# Patient Record
Sex: Male | Born: 1945
Health system: Southern US, Community
[De-identification: ages and names within clinical notes are randomized; demographics above are authoritative.]

## PROBLEM LIST (undated history)

## (undated) DIAGNOSIS — N401 Enlarged prostate with lower urinary tract symptoms: Secondary | ICD-10-CM

## (undated) DIAGNOSIS — L570 Actinic keratosis: Secondary | ICD-10-CM

## (undated) DIAGNOSIS — Z973 Presence of spectacles and contact lenses: Secondary | ICD-10-CM

## (undated) DIAGNOSIS — E7800 Pure hypercholesterolemia, unspecified: Secondary | ICD-10-CM

## (undated) DIAGNOSIS — I10 Essential (primary) hypertension: Secondary | ICD-10-CM

## (undated) DIAGNOSIS — F419 Anxiety disorder, unspecified: Secondary | ICD-10-CM

## (undated) DIAGNOSIS — Z860101 Personal history of adenomatous and serrated colon polyps: Secondary | ICD-10-CM

## (undated) DIAGNOSIS — I2510 Atherosclerotic heart disease of native coronary artery without angina pectoris: Secondary | ICD-10-CM

## (undated) DIAGNOSIS — R351 Nocturia: Secondary | ICD-10-CM

## (undated) DIAGNOSIS — H9190 Unspecified hearing loss, unspecified ear: Secondary | ICD-10-CM

## (undated) DIAGNOSIS — Z86010 Personal history of colonic polyps: Secondary | ICD-10-CM

## (undated) DIAGNOSIS — Z85828 Personal history of other malignant neoplasm of skin: Secondary | ICD-10-CM

## (undated) DIAGNOSIS — C61 Malignant neoplasm of prostate: Secondary | ICD-10-CM

## (undated) DIAGNOSIS — N400 Enlarged prostate without lower urinary tract symptoms: Secondary | ICD-10-CM

## (undated) DIAGNOSIS — E559 Vitamin D deficiency, unspecified: Secondary | ICD-10-CM

## (undated) DIAGNOSIS — R269 Unspecified abnormalities of gait and mobility: Secondary | ICD-10-CM

## (undated) HISTORY — PX: BASAL CELL CARCINOMA EXCISION: SHX1214

## (undated) HISTORY — DX: Nocturia: R35.1

## (undated) HISTORY — PX: APPENDECTOMY: SHX54

## (undated) HISTORY — DX: Atherosclerotic heart disease of native coronary artery without angina pectoris: I25.10

## (undated) HISTORY — DX: Actinic keratosis: L57.0

## (undated) HISTORY — DX: Vitamin D deficiency, unspecified: E55.9

## (undated) HISTORY — DX: Pure hypercholesterolemia, unspecified: E78.00

## (undated) HISTORY — DX: Benign prostatic hyperplasia without lower urinary tract symptoms: N40.0

## (undated) HISTORY — DX: Essential (primary) hypertension: I10

## (undated) HISTORY — DX: Anxiety disorder, unspecified: F41.9

## (undated) HISTORY — DX: Malignant neoplasm of prostate: C61

## (undated) HISTORY — PX: TONSILLECTOMY AND ADENOIDECTOMY: SUR1326

## (undated) HISTORY — PX: PROSTATE BIOPSY: SHX241

---
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
## 1998-07-18 ENCOUNTER — Encounter: Admission: RE | Admit: 1998-07-18 | Discharge: 1998-07-18 | Payer: Self-pay | Admitting: Infectious Diseases

## 1998-09-29 ENCOUNTER — Emergency Department (HOSPITAL_COMMUNITY): Admission: EM | Admit: 1998-09-29 | Discharge: 1998-09-29 | Payer: Self-pay | Admitting: Emergency Medicine

## 1998-09-29 ENCOUNTER — Encounter: Payer: Self-pay | Admitting: Emergency Medicine

## 1999-11-17 ENCOUNTER — Emergency Department (HOSPITAL_COMMUNITY): Admission: EM | Admit: 1999-11-17 | Discharge: 1999-11-17 | Payer: Self-pay | Admitting: Emergency Medicine

## 2001-07-21 ENCOUNTER — Encounter (INDEPENDENT_AMBULATORY_CARE_PROVIDER_SITE_OTHER): Payer: Self-pay | Admitting: *Deleted

## 2001-07-21 ENCOUNTER — Ambulatory Visit (HOSPITAL_COMMUNITY): Admission: RE | Admit: 2001-07-21 | Discharge: 2001-07-21 | Payer: Self-pay | Admitting: *Deleted

## 2003-07-25 ENCOUNTER — Ambulatory Visit (HOSPITAL_COMMUNITY): Admission: RE | Admit: 2003-07-25 | Discharge: 2003-07-25 | Payer: Self-pay | Admitting: *Deleted

## 2004-08-14 ENCOUNTER — Emergency Department (HOSPITAL_COMMUNITY): Admission: EM | Admit: 2004-08-14 | Discharge: 2004-08-14 | Payer: Self-pay | Admitting: *Deleted

## 2009-09-22 ENCOUNTER — Emergency Department (HOSPITAL_COMMUNITY): Admission: EM | Admit: 2009-09-22 | Discharge: 2009-09-23 | Payer: Self-pay | Admitting: Emergency Medicine

## 2010-11-11 LAB — POCT I-STAT, CHEM 8
BUN: 17 mg/dL (ref 6–23)
Calcium, Ion: 1.19 mmol/L (ref 1.12–1.32)
Chloride: 109 mEq/L (ref 96–112)
Creatinine, Ser: 1.1 mg/dL (ref 0.4–1.5)
Glucose, Bld: 118 mg/dL — ABNORMAL HIGH (ref 70–99)
HCT: 44 % (ref 39.0–52.0)
Hemoglobin: 15 g/dL (ref 13.0–17.0)
Potassium: 4.2 mEq/L (ref 3.5–5.1)
Sodium: 139 mEq/L (ref 135–145)
TCO2: 24 mmol/L (ref 0–100)

## 2010-11-11 LAB — URINALYSIS, ROUTINE W REFLEX MICROSCOPIC
Bilirubin Urine: NEGATIVE
Glucose, UA: NEGATIVE mg/dL
Hgb urine dipstick: NEGATIVE
Ketones, ur: NEGATIVE mg/dL
Nitrite: NEGATIVE
Protein, ur: NEGATIVE mg/dL
Specific Gravity, Urine: 1.02 (ref 1.005–1.030)
Urobilinogen, UA: 1 mg/dL (ref 0.0–1.0)
pH: 6.5 (ref 5.0–8.0)

## 2010-11-11 LAB — DIFFERENTIAL
Basophils Absolute: 0 10*3/uL (ref 0.0–0.1)
Basophils Relative: 1 % (ref 0–1)
Eosinophils Absolute: 0.2 10*3/uL (ref 0.0–0.7)
Eosinophils Relative: 3 % (ref 0–5)
Lymphocytes Relative: 25 % (ref 12–46)
Lymphs Abs: 1.7 10*3/uL (ref 0.7–4.0)
Monocytes Absolute: 0.7 10*3/uL (ref 0.1–1.0)
Monocytes Relative: 10 % (ref 3–12)
Neutro Abs: 4.1 10*3/uL (ref 1.7–7.7)
Neutrophils Relative %: 61 % (ref 43–77)

## 2010-11-11 LAB — CBC
HCT: 43.8 % (ref 39.0–52.0)
Hemoglobin: 14.6 g/dL (ref 13.0–17.0)
MCHC: 33.3 g/dL (ref 30.0–36.0)
MCV: 91.3 fL (ref 78.0–100.0)
Platelets: 204 10*3/uL (ref 150–400)
RBC: 4.8 MIL/uL (ref 4.22–5.81)
RDW: 13.2 % (ref 11.5–15.5)
WBC: 6.7 10*3/uL (ref 4.0–10.5)

## 2011-01-10 NOTE — Op Note (Signed)
NAME:  Willie Farrell, Willie Farrell                          ACCOUNT NO.:  [id number]   MEDICAL RECORD NO.:  [id number]                   PATIENT TYPE:  AMB   LOCATION:  ENDO                                 FACILITY:  [facility]   PHYSICIAN:  Georgiana Spinner, M.D.                 DATE OF BIRTH:  August 28, 1945   DATE OF PROCEDURE:  DATE OF DISCHARGE:                                 OPERATIVE REPORT   PROCEDURE:  Colonoscopy.   INDICATIONS:  Rectal bleeding.  The patient have bright red rectal bleeding.  Last time was five days ago.  He had a colonoscopy done by me two years ago  which showed diverticulosis.   ANESTHESIA:  None given at patient's request.   DESCRIPTION OF PROCEDURE:  With the patient mildly sedated and in the left  lateral decubitus position, the rectal examination was performed which was  unremarkable.  Subsequently the Olympus videoscopic colonoscope was inserted  into the rectum which appeared normal and was photographed and placed in  retroflexion to view the anal canal from above and it, too, appeared normal  as it had previously.  The endoscope was then straightened and advanced as  far as we could until it reached the proximal colon and the ascending colon  just above the ileocecal valve, at which point the discomfort to the patient  became intolerable and we decided to stop at that point and we withdrew the  colonoscope taking circumferential views of the colonic mucosa.  The prep  was slightly suboptimal but no gross lesions were seen and we decompressed  as we exited.  The patient's vital signs and pulse oximetry remained stable.  The patient tolerated the procedure well without apparent complications  other than transitory discomfort.   FINDINGS:  Mild diverticulosis as previously in the sigmoid colon.  Otherwise an unremarkable colonoscopic examination to the ascending colon.  The cecum and terminal ileum have been seen previously.   IMPRESSION:  Essentially negative  examination other than minimal  diverticulosis.   PLAN:  Have the patient follow up with me on the as-needed basis only.                                               Georgiana Spinner, M.D.    GMO/MEDQ  D:  07/25/2003  T:  07/25/2003  Job:  [id number]

## 2011-01-10 NOTE — Procedures (Signed)
[facility]  Patient:    Willie Farrell, Willie Farrell Visit Number: [id number] MRN: [id number]          Service Type: END Location: ENDO Attending Physician:  Sabino Gasser Dictated by:   Sabino Gasser, M.D. Proc. Date: 07/21/01 Admit Date:  07/21/2001                             Procedure Report  PROCEDURE:  Upper endoscopy.  INDICATION FOR PROCEDURE:  Gastroesophageal reflux disease.  ANESTHESIA:  Demerol 70, Versed 6 mg.  DESCRIPTION OF PROCEDURE:  With the patient mildly sedated in the left lateral decubitus position, the Olympus videoscopic endoscope was inserted in the mouth and passed under direct vision through the esophagus which appeared normal. There was no evidence of Barretts esophagus seen but there was a hiatal hernia photographed. We entered into the stomach. The fundus, body, and antrum were visualized. In the body and antrum, the area of the stomach was somewhat reddened and inflamed and there was blood flex actually seen in the stomach. This was photographed. We entered through the pylorus into the duodenal bulb which showed some changes of mild duodenitis. The second portion of the duodenum appeared normal. From this point, the endoscope was slowly withdrawn taking circumferential views of the entire duodenal mucosa until the endoscope was then pulled back in the stomach, placed in retroflexion to view the stomach from below and a large hiatal hernia was seen and photographed. The endoscope was then straightened and withdrawn taking circumferential views of the remaining gastric and esophageal mucosa stopping only to biopsy the body and antrum that had been noted previously. The patients vital signs and pulse oximeter remained stable. The patient tolerated the procedure well without apparent complications.  FINDINGS:  Changes in the body and antrum of gastritis with some blood flecks seen in the stomach, hiatal hernia, mild duodenitis.  PLAN:    Await biopsy report. The patient will call me for results or check with me and follow-up with me as an outpatient. Proceed to colonoscopy as planned. Dictated by:   Sabino Gasser, M.D. Attending Physician:  Sabino Gasser DD:  07/21/01 TD:  07/21/01 Job: [id number]

## 2011-01-10 NOTE — Procedures (Signed)
[facility]  Patient:    Willie Farrell, Willie Farrell Visit Number: [id number] MRN: [id number]          Service Type: END Location: ENDO Attending Physician:  Sabino Gasser Dictated by:   Sabino Gasser, M.D. Proc. Date: 07/21/01 Admit Date:  07/21/2001                             Procedure Report  PROCEDURE:  Colonoscopy.  INDICATION FOR PROCEDURE:  Heme positive stools, colon cancer screening.  ANESTHESIA:  Demerol 30, Versed 4 mg.  DESCRIPTION OF PROCEDURE:  With the patient mildly sedated in the left lateral decubitus position, the Olympus videoscopic colonoscope was inserted into her rectum after a normal rectal exam passed under direct vision to the cecum identified by the ileocecal valve and appendiceal orifice. We entered into the ileocecal valve and terminal ileum also appeared normal. From this point, the colonoscope was slowly withdrawn taking circumferential views of the entire colonic mucosa, stopping in the sigmoid colon to photograph mild to moderate diverticulosis. The rectum appeared normal on direct and retroflexed view. The endoscope was then straightened and withdrawn through the anal canal which also appeared normal. The patients vital signs and pulse oximeter remained stable. The patient tolerated the procedure well without apparent complications.  FINDINGS:  Mild diverticulosis of sigmoid colon, otherwise an unremarkable colonoscopic examination including the terminal ileum.  PLAN:  See endoscopy note for further details. Dictated by:   Sabino Gasser, M.D. Attending Physician:  Sabino Gasser DD:  07/21/01 TD:  07/21/01 Job: [id number]

## 2011-01-13 ENCOUNTER — Other Ambulatory Visit: Payer: Self-pay | Admitting: Internal Medicine

## 2011-01-13 DIAGNOSIS — R1011 Right upper quadrant pain: Secondary | ICD-10-CM

## 2011-01-15 ENCOUNTER — Ambulatory Visit
Admission: RE | Admit: 2011-01-15 | Discharge: 2011-01-15 | Disposition: A | Discharge status: Home / Self Care | Payer: 59 | Source: Ambulatory Visit | Attending: Internal Medicine | Admitting: Internal Medicine

## 2011-01-15 DIAGNOSIS — R1011 Right upper quadrant pain: Secondary | ICD-10-CM

## 2014-02-27 ENCOUNTER — Other Ambulatory Visit: Payer: Self-pay | Admitting: Internal Medicine

## 2014-02-27 DIAGNOSIS — M899 Disorder of bone, unspecified: Secondary | ICD-10-CM

## 2014-03-01 ENCOUNTER — Other Ambulatory Visit: Payer: 59

## 2014-03-06 ENCOUNTER — Ambulatory Visit
Admission: RE | Admit: 2014-03-06 | Discharge: 2014-03-06 | Disposition: A | Discharge status: Home / Self Care | Payer: BC Managed Care – PPO | Source: Ambulatory Visit | Attending: Internal Medicine | Admitting: Internal Medicine

## 2014-03-06 DIAGNOSIS — M899 Disorder of bone, unspecified: Secondary | ICD-10-CM

## 2014-03-06 MED ORDER — GADOBENATE DIMEGLUMINE 529 MG/ML IV SOLN
20.0000 mL | Freq: Once | INTRAVENOUS | Status: AC | PRN
  Administered 2014-03-06: 20 mL via INTRAVENOUS

## 2016-03-04 ENCOUNTER — Other Ambulatory Visit: Payer: Self-pay | Admitting: Internal Medicine

## 2016-03-04 DIAGNOSIS — G459 Transient cerebral ischemic attack, unspecified: Secondary | ICD-10-CM

## 2016-03-05 ENCOUNTER — Ambulatory Visit
Admission: RE | Admit: 2016-03-05 | Discharge: 2016-03-05 | Disposition: A | Discharge status: Home / Self Care | Payer: BLUE CROSS/BLUE SHIELD | Source: Ambulatory Visit | Attending: Internal Medicine | Admitting: Internal Medicine

## 2016-03-05 DIAGNOSIS — G459 Transient cerebral ischemic attack, unspecified: Secondary | ICD-10-CM

## 2016-03-19 ENCOUNTER — Encounter: Payer: Self-pay | Admitting: Neurology

## 2016-03-19 ENCOUNTER — Ambulatory Visit (INDEPENDENT_AMBULATORY_CARE_PROVIDER_SITE_OTHER): Payer: BLUE CROSS/BLUE SHIELD | Admitting: Neurology

## 2016-03-19 VITALS — BP 120/79 | HR 81 | Resp 18 | Ht 72.0 in | Wt 244.0 lb

## 2016-03-19 DIAGNOSIS — R42 Dizziness and giddiness: Secondary | ICD-10-CM | POA: Diagnosis not present

## 2016-03-19 NOTE — Patient Instructions (Signed)
Please start taking a baby aspirin for prevention.   Please increase your water intake and decrease your sweet tea intake.   Exam looks good, negative for vertigo, negative for OH.   Think about seeing Dr. Einar Gip, in particular for consideration of heart monitor, Holter vs. Implantable.   Please remember, that dizziness can recur without warning, triggers could be stress, sleep deprivation, dehydration and even taking a bumpy car ride, sometimes an acute illness, even a common (viral) cold. It can last hours or days. Please change positions slowly and always stay well-hydrated.   Ask your wife to look out for snoring and apneic spells - talk to Dr. Shelia Media about doing a sleep study.   Work up was benign.  I will see you back as needed.

## 2016-03-19 NOTE — Progress Notes (Signed)
Subjective:    Patient ID: Willie Farrell is a 70 y.o. male.  HPI     Star Age, MD, PhD [facility] [address] [place], [place] [address]  Dear Dr. Shelia Media,   I saw your patient and colleague, Dr. Anda Kraft, upon your kind request in my neurologic clinic today for initial consultation of his dizziness, possible TIA. The patient is unaccompanied today. 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 He was seen at [facility] in the emergency room and had extensive workup in [place], [place]. At the time of the episode he was with his grandson. He had a head CT without contrast in the emergency room which was negative. He did have an episode of vomiting. This was before he went to the emergency room. Emergency room records were reviewed from 02/09/2016. He was noted to have sinus bradycardia, EKG showed incomplete right bundle branch block. Blood work showed normal CBC with differential, normal BMP, troponin negative, chest x-ray single view portable showed no acute cardiopulmonary disease. CT head without contrast on 02/09/2016 showed no evidence of an acute intracranial abnormality. He was felt to have near-syncope, possibly a hypoglycemic episode. Of note, blood sugar level was 124 at the time of checking in the emergency room.   He was discharged from the emergency room. He had another brief episode of dizziness upon standing up in church about 3 weeks later. This happened when he was kneeling and stood up, symptoms lasted for less than a minute. He denies frank spinning sensation or swaying but does sometimes feels a little off when walking, feels like he may be veering to one side. Thankfully he has not fallen. He denies any headache,  one-sided weakness, numbness, slurring of speech, droopy face, denies any sinister snoring, has never been told that he pauses in his breathing while asleep but wife has mentioned moaning while he is asleep. He has never had a sleep study.  Of note, he does not typically hydrate well with water. He likes to drink 2 large cups of coffee in the morning, 1 soda per day, usually 5 days a week, and sweet tea several glasses per day. He admits that he drinks sweet tea. His A1c he reports is less than 7. I reviewed your office note from 03/04/2016, which you kindly included. Vitals were stable. Exam was nonfocal. Of note, he did not see Dr. Einar Gip, but had an echocardiogram in his office on 02/21/2016 which showed left ventricle cavity of normal size, normal global wall motion. Calculated EF 56%, trace mitral regurgitation, mild tricuspid regurgitation, no evidence of pulmonary hypertension. Bilateral carotid ultrasound at Dr. Irven Shelling office from 02/21/2016 showed no hemodynamically significant arterial disease in the internal carotid arteries bilaterally. Normal study without any significant plaque burden. Antegrade right vertebral artery flow. Antegrade left vertebral artery flow. Current medications include Flomax, Crestor, he stopped myrbetriq. He was advised to start a baby aspirin but has not started it yet.  His Past Medical History Is Significant For: Past Medical History:  Diagnosis Date  . Actinic keratoses   . BPH (benign prostatic hyperplasia)   . Hypercholesteremia   . Hypertension   . Vitamin D deficiency     His Past Surgical History Is Significant For: Past Surgical History:  Procedure Laterality Date  . APPENDECTOMY    .  TONSILLECTOMY AND ADENOIDECTOMY      His Family History Is Significant For: Family History  Problem Relation Age of Onset  . Heart attack Father   . Diabetes Father     His Social History Is Significant For: Social History   Social History  . Marital status:  Married    Spouse name: N/A  . Number of children: 3  . Years of education: MD   Occupational History  . [employer]    Social History Main Topics  . Smoking status: Former Research scientist (life sciences)  . Smokeless tobacco: None     Comment: only smoked in college  . Alcohol use Yes  . Drug use: No  . Sexual activity: Not Asked   Other Topics Concern  . None   Social History Narrative   Drinks 2 cups of coffee a day     His Allergies Are:  No Known Allergies:   His Current Medications Are:  Outpatient Encounter Prescriptions as of 03/19/2016  Medication Sig  . rosuvastatin (CRESTOR) 20 MG tablet Take 20 mg by mouth daily.  . tamsulosin (FLOMAX) 0.4 MG CAPS capsule Take 0.4 mg by mouth daily.  . mirabegron ER (MYRBETRIQ) 50 MG TB24 tablet Take by mouth.   No facility-administered encounter medications on file as of 03/19/2016.   :   Review of Systems:  Out of a complete 14 point review of systems, all are reviewed and negative with the exception of these symptoms as listed below:  Review of Systems  Neurological:       Started about 5 weeks ago. Patient had an episode of feeling like he would have a syncopal episode. Patient sat himself down. No LOC. 30 min later he had nausea vomiting. Seen at ED.  2 weeks ago, had similar episode at church.  Still having off and on episodes but describe them more like "ataxia" rather than dizziness.     Objective:  Neurologic Exam  Physical Exam Physical Examination:   Vitals:   03/19/16 0905 03/19/16 0909  BP: 119/74 120/79  Pulse: 78 81  Resp: 18    General Examination: The patient is a very pleasant 70 y.o. male in no acute distress. He appears well-developed and well-nourished and well groomed.   He has very mild orthostatic symptoms of feeling lightheaded, no vertigo, no significant orthostatic hypotension.  HEENT: Normocephalic, atraumatic, pupils are equal, round and reactive to light and accommodation. Funduscopic exam is  normal with sharp disc margins noted. Extraocular tracking is good without limitation to gaze excursion or nystagmus noted. Normal smooth pursuit is noted. Hearing is Impaired bilaterally, he has hearing aids but does not have them in. Dix-Hallpike negative. Tympanic membranes are clear bilaterally, Mild cerumen bilaterally, left more than right but no impaction. Face is symmetric with normal facial animation and normal facial sensation. Speech is clear with no dysarthria noted. There is no hypophonia. There is no lip, neck/head, jaw or voice tremor. Neck is supple with full range of passive and active motion. There are no carotid bruits on auscultation. Oropharynx exam reveals: mild mouth dryness, adequate dental hygiene and mild airway crowding, due to smaller airway and redundant soft palate, tonsils are absent. Mallampati is class II. Tongue protrudes centrally and palate elevates symmetrically.   Chest: Clear to auscultation without wheezing, rhonchi or crackles noted.  Heart: S1+S2+0, regular and normal without murmurs, rubs or gallops noted.   Abdomen: Soft, non-tender and non-distended with normal bowel sounds appreciated on auscultation.  Extremities: There is  no pitting edema in the distal lower extremities bilaterally. Pedal pulses are intact.  Skin: Warm and dry without trophic changes noted. There are no varicose veins.  Musculoskeletal: exam reveals no obvious joint deformities, tenderness or joint swelling or erythema.   Neurologically:  Mental status: The patient is awake, alert and oriented in all 4 spheres. His immediate and remote memory, attention, language skills and fund of knowledge are appropriate. There is no evidence of aphasia, agnosia, apraxia or anomia. Speech is clear with normal prosody and enunciation. Thought process is linear. Mood is normal and affect is normal.  Cranial nerves II - XII are as described above under HEENT exam. In addition: shoulder shrug is normal  with equal shoulder height noted. Motor exam: Normal bulk, strength and tone is noted. There is no drift, tremor or rebound. Romberg is negative. Reflexes are 2+ to 3+ throughout. Babinski: Toes are flexor bilaterally. Fine motor skills and coordination: intact with normal finger taps, normal hand movements, normal rapid alternating patting, normal foot taps and normal foot agility.  Cerebellar testing: No dysmetria or intention tremor on finger to nose testing. Heel to shin is unremarkable bilaterally. There is no truncal or gait ataxia.  Sensory exam: intact to light touch, pinprick, vibration, temperature sense in the upper and lower extremities.  Gait, station and balance: He stands easily. No veering to one side is noted. No leaning to one side is noted. Posture is age-appropriate and stance is narrow based. Gait shows normal stride length and normal pace. No problems turning are noted. Tandem walk is slightly challenging, no ataxia noted.              Assessment and Plan:   In summary, Nihar Serafino is a very pleasant 70 y.o.-year old male who presents after an emergency room visit for a recent episode of sudden dizziness, associated with diaphoresis, nausea, and swaying, loss of balance.  doubtful that this is a TIA in particular with recurrence of milder symptoms, particularly with sudden change of posture from kneeling to standing or walking upstairs risk leak. He could have mild orthostatic hypotension. He had extensive workup including head CT, MRI, echocardiogram, carotid Doppler studies, EKG with benign results. I reviewed all the test results. His physical exam and neurological exam are nonfocal and he is reassured in that regard. Nevertheless, I had a few suggestions for him today. I asked him to start taking a baby aspirin for prevention. I asked him to increase his water intake and decrease his sweets see intake. He is advised to consider seeing Dr. Einar Gip for consultation, especially  consideration of a heart monitor such as Holter monitor versus implantable loop monitor. He is furthermore advised to change positions slowly and we stay hydrated, mostly with water. He is advised to ask his wife to look out for snoring and apneic spells and talk to you about potentially doing a sleep study.  From my end of things, I did not suggest any additional test. He had extensive workup. I suggested an as needed follow-up. I answered all his questions today and he was in agreement. Thank you very much for allowing me to participate in the care of this nice patient. If I can be of any further assistance to you please do not hesitate to call me at [phone].  Sincerely,   Star Age, MD, PhD

## 2017-05-08 DIAGNOSIS — K210 Gastro-esophageal reflux disease with esophagitis: Secondary | ICD-10-CM | POA: Diagnosis not present

## 2017-05-08 DIAGNOSIS — R1314 Dysphagia, pharyngoesophageal phase: Secondary | ICD-10-CM | POA: Diagnosis not present

## 2018-04-13 DIAGNOSIS — Z125 Encounter for screening for malignant neoplasm of prostate: Secondary | ICD-10-CM | POA: Diagnosis not present

## 2018-04-13 DIAGNOSIS — E785 Hyperlipidemia, unspecified: Secondary | ICD-10-CM | POA: Diagnosis not present

## 2018-04-13 DIAGNOSIS — I10 Essential (primary) hypertension: Secondary | ICD-10-CM | POA: Diagnosis not present

## 2018-04-23 DIAGNOSIS — L821 Other seborrheic keratosis: Secondary | ICD-10-CM | POA: Diagnosis not present

## 2018-04-23 DIAGNOSIS — X32XXXA Exposure to sunlight, initial encounter: Secondary | ICD-10-CM | POA: Diagnosis not present

## 2018-04-23 DIAGNOSIS — L570 Actinic keratosis: Secondary | ICD-10-CM | POA: Diagnosis not present

## 2018-04-23 DIAGNOSIS — D225 Melanocytic nevi of trunk: Secondary | ICD-10-CM | POA: Diagnosis not present

## 2018-04-23 DIAGNOSIS — L820 Inflamed seborrheic keratosis: Secondary | ICD-10-CM | POA: Diagnosis not present

## 2018-04-23 DIAGNOSIS — C44311 Basal cell carcinoma of skin of nose: Secondary | ICD-10-CM | POA: Diagnosis not present

## 2018-04-27 DIAGNOSIS — E291 Testicular hypofunction: Secondary | ICD-10-CM | POA: Diagnosis not present

## 2018-04-27 DIAGNOSIS — E785 Hyperlipidemia, unspecified: Secondary | ICD-10-CM | POA: Diagnosis not present

## 2018-04-27 DIAGNOSIS — E789 Disorder of lipoprotein metabolism, unspecified: Secondary | ICD-10-CM | POA: Diagnosis not present

## 2018-04-27 DIAGNOSIS — E559 Vitamin D deficiency, unspecified: Secondary | ICD-10-CM | POA: Diagnosis not present

## 2018-04-27 DIAGNOSIS — F411 Generalized anxiety disorder: Secondary | ICD-10-CM | POA: Diagnosis not present

## 2018-04-27 DIAGNOSIS — N4289 Other specified disorders of prostate: Secondary | ICD-10-CM | POA: Diagnosis not present

## 2018-04-27 DIAGNOSIS — R1310 Dysphagia, unspecified: Secondary | ICD-10-CM | POA: Diagnosis not present

## 2018-04-27 DIAGNOSIS — Z0000 Encounter for general adult medical examination without abnormal findings: Secondary | ICD-10-CM | POA: Diagnosis not present

## 2018-04-27 DIAGNOSIS — G5600 Carpal tunnel syndrome, unspecified upper limb: Secondary | ICD-10-CM | POA: Diagnosis not present

## 2018-04-27 DIAGNOSIS — K219 Gastro-esophageal reflux disease without esophagitis: Secondary | ICD-10-CM | POA: Diagnosis not present

## 2018-04-27 DIAGNOSIS — R972 Elevated prostate specific antigen [PSA]: Secondary | ICD-10-CM | POA: Diagnosis not present

## 2018-04-27 DIAGNOSIS — I10 Essential (primary) hypertension: Secondary | ICD-10-CM | POA: Diagnosis not present

## 2018-04-27 DIAGNOSIS — N401 Enlarged prostate with lower urinary tract symptoms: Secondary | ICD-10-CM | POA: Diagnosis not present

## 2018-05-14 DIAGNOSIS — B078 Other viral warts: Secondary | ICD-10-CM | POA: Diagnosis not present

## 2018-05-14 DIAGNOSIS — X32XXXD Exposure to sunlight, subsequent encounter: Secondary | ICD-10-CM | POA: Diagnosis not present

## 2018-05-14 DIAGNOSIS — L570 Actinic keratosis: Secondary | ICD-10-CM | POA: Diagnosis not present

## 2018-05-14 DIAGNOSIS — L820 Inflamed seborrheic keratosis: Secondary | ICD-10-CM | POA: Diagnosis not present

## 2018-05-14 DIAGNOSIS — C44311 Basal cell carcinoma of skin of nose: Secondary | ICD-10-CM | POA: Diagnosis not present

## 2018-05-17 DIAGNOSIS — E291 Testicular hypofunction: Secondary | ICD-10-CM | POA: Diagnosis not present

## 2018-05-17 DIAGNOSIS — N401 Enlarged prostate with lower urinary tract symptoms: Secondary | ICD-10-CM | POA: Diagnosis not present

## 2018-05-17 DIAGNOSIS — R972 Elevated prostate specific antigen [PSA]: Secondary | ICD-10-CM | POA: Diagnosis not present

## 2018-05-17 DIAGNOSIS — N138 Other obstructive and reflux uropathy: Secondary | ICD-10-CM | POA: Diagnosis not present

## 2018-05-17 DIAGNOSIS — N3941 Urge incontinence: Secondary | ICD-10-CM | POA: Diagnosis not present

## 2018-06-29 ENCOUNTER — Other Ambulatory Visit: Payer: Self-pay | Admitting: Internal Medicine

## 2018-06-29 ENCOUNTER — Ambulatory Visit
Admission: RE | Admit: 2018-06-29 | Discharge: 2018-06-29 | Disposition: A | Discharge status: Home / Self Care | Payer: Medicare Other | Source: Ambulatory Visit | Attending: Internal Medicine | Admitting: Internal Medicine

## 2018-06-29 DIAGNOSIS — R109 Unspecified abdominal pain: Secondary | ICD-10-CM

## 2018-06-29 DIAGNOSIS — N390 Urinary tract infection, site not specified: Secondary | ICD-10-CM | POA: Diagnosis not present

## 2018-06-29 DIAGNOSIS — R319 Hematuria, unspecified: Secondary | ICD-10-CM

## 2018-06-29 DIAGNOSIS — R3129 Other microscopic hematuria: Secondary | ICD-10-CM | POA: Diagnosis not present

## 2018-06-30 DIAGNOSIS — R3121 Asymptomatic microscopic hematuria: Secondary | ICD-10-CM | POA: Diagnosis not present

## 2018-06-30 DIAGNOSIS — N200 Calculus of kidney: Secondary | ICD-10-CM | POA: Diagnosis not present

## 2018-06-30 DIAGNOSIS — R972 Elevated prostate specific antigen [PSA]: Secondary | ICD-10-CM | POA: Diagnosis not present

## 2018-06-30 DIAGNOSIS — R82998 Other abnormal findings in urine: Secondary | ICD-10-CM | POA: Diagnosis not present

## 2018-07-02 DIAGNOSIS — R972 Elevated prostate specific antigen [PSA]: Secondary | ICD-10-CM | POA: Diagnosis not present

## 2018-08-23 DIAGNOSIS — R972 Elevated prostate specific antigen [PSA]: Secondary | ICD-10-CM | POA: Diagnosis not present

## 2018-08-23 DIAGNOSIS — N401 Enlarged prostate with lower urinary tract symptoms: Secondary | ICD-10-CM | POA: Diagnosis not present

## 2018-08-23 DIAGNOSIS — N528 Other male erectile dysfunction: Secondary | ICD-10-CM | POA: Diagnosis not present

## 2018-08-23 DIAGNOSIS — R3915 Urgency of urination: Secondary | ICD-10-CM | POA: Diagnosis not present

## 2018-08-23 DIAGNOSIS — N138 Other obstructive and reflux uropathy: Secondary | ICD-10-CM | POA: Diagnosis not present

## 2018-08-23 DIAGNOSIS — R8289 Other abnormal findings on cytological and histological examination of urine: Secondary | ICD-10-CM | POA: Diagnosis not present

## 2018-09-03 DIAGNOSIS — N401 Enlarged prostate with lower urinary tract symptoms: Secondary | ICD-10-CM | POA: Diagnosis not present

## 2018-09-03 DIAGNOSIS — R3915 Urgency of urination: Secondary | ICD-10-CM | POA: Diagnosis not present

## 2018-09-03 DIAGNOSIS — N138 Other obstructive and reflux uropathy: Secondary | ICD-10-CM | POA: Diagnosis not present

## 2018-09-03 DIAGNOSIS — R972 Elevated prostate specific antigen [PSA]: Secondary | ICD-10-CM | POA: Diagnosis not present

## 2018-09-03 DIAGNOSIS — N3941 Urge incontinence: Secondary | ICD-10-CM | POA: Diagnosis not present

## 2018-10-05 DIAGNOSIS — R896 Abnormal cytological findings in specimens from other organs, systems and tissues: Secondary | ICD-10-CM | POA: Diagnosis not present

## 2018-10-05 DIAGNOSIS — R82998 Other abnormal findings in urine: Secondary | ICD-10-CM | POA: Diagnosis not present

## 2018-10-05 DIAGNOSIS — E785 Hyperlipidemia, unspecified: Secondary | ICD-10-CM | POA: Diagnosis not present

## 2018-10-05 DIAGNOSIS — R350 Frequency of micturition: Secondary | ICD-10-CM | POA: Diagnosis not present

## 2018-10-05 DIAGNOSIS — R3915 Urgency of urination: Secondary | ICD-10-CM | POA: Diagnosis not present

## 2018-10-05 HISTORY — PX: CYSTOSCOPY WITH RETROGRADE PYELOGRAM, URETEROSCOPY AND STENT PLACEMENT: SHX5789

## 2018-10-12 DIAGNOSIS — R399 Unspecified symptoms and signs involving the genitourinary system: Secondary | ICD-10-CM | POA: Diagnosis not present

## 2018-10-21 DIAGNOSIS — R399 Unspecified symptoms and signs involving the genitourinary system: Secondary | ICD-10-CM | POA: Diagnosis not present

## 2018-10-21 DIAGNOSIS — R109 Unspecified abdominal pain: Secondary | ICD-10-CM | POA: Diagnosis not present

## 2018-10-21 DIAGNOSIS — R82998 Other abnormal findings in urine: Secondary | ICD-10-CM | POA: Diagnosis not present

## 2018-10-21 DIAGNOSIS — R3915 Urgency of urination: Secondary | ICD-10-CM | POA: Diagnosis not present

## 2018-10-21 DIAGNOSIS — R972 Elevated prostate specific antigen [PSA]: Secondary | ICD-10-CM | POA: Diagnosis not present

## 2019-06-02 DIAGNOSIS — H2513 Age-related nuclear cataract, bilateral: Secondary | ICD-10-CM | POA: Diagnosis not present

## 2019-06-23 DIAGNOSIS — Z23 Encounter for immunization: Secondary | ICD-10-CM | POA: Diagnosis not present

## 2019-07-08 DIAGNOSIS — Z20828 Contact with and (suspected) exposure to other viral communicable diseases: Secondary | ICD-10-CM | POA: Diagnosis not present

## 2019-07-08 DIAGNOSIS — Z03818 Encounter for observation for suspected exposure to other biological agents ruled out: Secondary | ICD-10-CM | POA: Diagnosis not present

## 2019-09-15 ENCOUNTER — Ambulatory Visit: Payer: Medicare Other | Attending: Internal Medicine

## 2019-09-15 DIAGNOSIS — Z20822 Contact with and (suspected) exposure to covid-19: Secondary | ICD-10-CM | POA: Diagnosis not present

## 2019-09-16 LAB — NOVEL CORONAVIRUS, NAA: SARS-CoV-2, NAA: NOT DETECTED

## 2019-09-20 DIAGNOSIS — Z125 Encounter for screening for malignant neoplasm of prostate: Secondary | ICD-10-CM | POA: Diagnosis not present

## 2019-09-20 DIAGNOSIS — E559 Vitamin D deficiency, unspecified: Secondary | ICD-10-CM | POA: Diagnosis not present

## 2019-09-20 DIAGNOSIS — I10 Essential (primary) hypertension: Secondary | ICD-10-CM | POA: Diagnosis not present

## 2019-09-20 DIAGNOSIS — E785 Hyperlipidemia, unspecified: Secondary | ICD-10-CM | POA: Diagnosis not present

## 2019-09-21 DIAGNOSIS — R3915 Urgency of urination: Secondary | ICD-10-CM | POA: Diagnosis not present

## 2019-09-21 DIAGNOSIS — Z6831 Body mass index (BMI) 31.0-31.9, adult: Secondary | ICD-10-CM | POA: Diagnosis not present

## 2019-09-21 DIAGNOSIS — R972 Elevated prostate specific antigen [PSA]: Secondary | ICD-10-CM | POA: Diagnosis not present

## 2019-09-21 DIAGNOSIS — Z0000 Encounter for general adult medical examination without abnormal findings: Secondary | ICD-10-CM | POA: Diagnosis not present

## 2019-09-21 DIAGNOSIS — H9193 Unspecified hearing loss, bilateral: Secondary | ICD-10-CM | POA: Diagnosis not present

## 2019-09-21 DIAGNOSIS — E785 Hyperlipidemia, unspecified: Secondary | ICD-10-CM | POA: Diagnosis not present

## 2019-09-21 DIAGNOSIS — L570 Actinic keratosis: Secondary | ICD-10-CM | POA: Diagnosis not present

## 2019-09-21 DIAGNOSIS — R351 Nocturia: Secondary | ICD-10-CM | POA: Diagnosis not present

## 2019-09-21 DIAGNOSIS — R05 Cough: Secondary | ICD-10-CM | POA: Diagnosis not present

## 2020-06-15 DIAGNOSIS — Z23 Encounter for immunization: Secondary | ICD-10-CM | POA: Diagnosis not present

## 2020-07-10 DIAGNOSIS — H2513 Age-related nuclear cataract, bilateral: Secondary | ICD-10-CM | POA: Diagnosis not present

## 2020-09-20 DIAGNOSIS — I10 Essential (primary) hypertension: Secondary | ICD-10-CM | POA: Diagnosis not present

## 2020-09-20 DIAGNOSIS — E785 Hyperlipidemia, unspecified: Secondary | ICD-10-CM | POA: Diagnosis not present

## 2020-09-20 DIAGNOSIS — Z125 Encounter for screening for malignant neoplasm of prostate: Secondary | ICD-10-CM | POA: Diagnosis not present

## 2020-09-27 DIAGNOSIS — R059 Cough, unspecified: Secondary | ICD-10-CM | POA: Diagnosis not present

## 2020-09-27 DIAGNOSIS — G5603 Carpal tunnel syndrome, bilateral upper limbs: Secondary | ICD-10-CM | POA: Diagnosis not present

## 2020-09-27 DIAGNOSIS — L570 Actinic keratosis: Secondary | ICD-10-CM | POA: Diagnosis not present

## 2020-09-27 DIAGNOSIS — K219 Gastro-esophageal reflux disease without esophagitis: Secondary | ICD-10-CM | POA: Diagnosis not present

## 2020-09-27 DIAGNOSIS — Z0001 Encounter for general adult medical examination with abnormal findings: Secondary | ICD-10-CM | POA: Diagnosis not present

## 2020-09-27 DIAGNOSIS — I10 Essential (primary) hypertension: Secondary | ICD-10-CM | POA: Diagnosis not present

## 2020-09-27 DIAGNOSIS — G8929 Other chronic pain: Secondary | ICD-10-CM | POA: Diagnosis not present

## 2020-09-27 DIAGNOSIS — M25572 Pain in left ankle and joints of left foot: Secondary | ICD-10-CM | POA: Diagnosis not present

## 2020-09-27 DIAGNOSIS — N4289 Other specified disorders of prostate: Secondary | ICD-10-CM | POA: Diagnosis not present

## 2020-09-27 DIAGNOSIS — M25551 Pain in right hip: Secondary | ICD-10-CM | POA: Diagnosis not present

## 2020-09-27 DIAGNOSIS — N401 Enlarged prostate with lower urinary tract symptoms: Secondary | ICD-10-CM | POA: Diagnosis not present

## 2020-09-27 DIAGNOSIS — M25571 Pain in right ankle and joints of right foot: Secondary | ICD-10-CM | POA: Diagnosis not present

## 2020-09-28 ENCOUNTER — Other Ambulatory Visit: Payer: Self-pay | Admitting: Internal Medicine

## 2020-09-28 DIAGNOSIS — I10 Essential (primary) hypertension: Secondary | ICD-10-CM

## 2020-10-22 ENCOUNTER — Ambulatory Visit
Admission: RE | Admit: 2020-10-22 | Discharge: 2020-10-22 | Disposition: A | Discharge status: Home / Self Care | Payer: No Typology Code available for payment source | Source: Ambulatory Visit | Attending: Internal Medicine | Admitting: Internal Medicine

## 2020-10-22 DIAGNOSIS — I2510 Atherosclerotic heart disease of native coronary artery without angina pectoris: Secondary | ICD-10-CM | POA: Diagnosis not present

## 2020-10-22 DIAGNOSIS — I10 Essential (primary) hypertension: Secondary | ICD-10-CM

## 2020-10-22 DIAGNOSIS — R911 Solitary pulmonary nodule: Secondary | ICD-10-CM | POA: Diagnosis not present

## 2020-10-25 ENCOUNTER — Other Ambulatory Visit: Payer: Self-pay | Admitting: Internal Medicine

## 2020-10-25 DIAGNOSIS — I2584 Coronary atherosclerosis due to calcified coronary lesion: Secondary | ICD-10-CM | POA: Diagnosis not present

## 2020-10-25 DIAGNOSIS — R918 Other nonspecific abnormal finding of lung field: Secondary | ICD-10-CM

## 2020-10-25 DIAGNOSIS — I2510 Atherosclerotic heart disease of native coronary artery without angina pectoris: Secondary | ICD-10-CM | POA: Diagnosis not present

## 2020-11-08 ENCOUNTER — Ambulatory Visit: Payer: Medicare Other | Admitting: Sports Medicine

## 2020-11-19 ENCOUNTER — Ambulatory Visit: Payer: Medicare Other | Admitting: Cardiology

## 2020-11-19 ENCOUNTER — Other Ambulatory Visit: Payer: Self-pay

## 2020-11-19 ENCOUNTER — Encounter: Payer: Self-pay | Admitting: Cardiology

## 2020-11-19 VITALS — BP 117/75 | HR 69 | Temp 98.3°F | Resp 16 | Ht 72.0 in | Wt 226.2 lb

## 2020-11-19 DIAGNOSIS — I452 Bifascicular block: Secondary | ICD-10-CM | POA: Diagnosis not present

## 2020-11-19 DIAGNOSIS — E7800 Pure hypercholesterolemia, unspecified: Secondary | ICD-10-CM

## 2020-11-19 DIAGNOSIS — R931 Abnormal findings on diagnostic imaging of heart and coronary circulation: Secondary | ICD-10-CM

## 2020-11-19 DIAGNOSIS — I2510 Atherosclerotic heart disease of native coronary artery without angina pectoris: Secondary | ICD-10-CM

## 2020-11-19 NOTE — Progress Notes (Signed)
Primary Physician/Referring:  Willie Pretty, MD  Patient ID: Willie Farrell, male    DOB: 23-Feb-1946, 75 y.o.   MRN: [id number]  Chief Complaint  Patient presents with  . Coronary Artery Disease  . New Patient (Initial Visit)    Referred by Dr. 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  He has not been active and has not been exercising except doing routine chores around the house or on his yard.  However he has noticed "fatigue".  Otherwise denies chest pain, dyspnea, palpitations, dizziness or syncope.  There is no family history of premature coronary artery disease.  Past medical history significant for hyperlipidemia.  Past Medical History:  Diagnosis Date  . Actinic keratoses   . BPH (benign prostatic hyperplasia)   . CAD (coronary artery disease)   . Hypercholesteremia   . Vitamin D deficiency    Past Surgical History:  Procedure Laterality Date  . APPENDECTOMY    . TONSILLECTOMY AND ADENOIDECTOMY     Family History  Problem Relation Age of Onset  . Diabetes Father   . Heart attack Father 25    Social History   Tobacco Use  . Smoking status: Former Smoker    Years: 2.00    Types: Cigarettes    Quit date: 11/19/1968    Years since quitting: 75.0  . Smokeless tobacco: Never Used  . Tobacco comment: only smoked in college  Substance Use Topics  . Alcohol use: Yes    Alcohol/week: 2.0 standard drinks    Types: 2 Standard drinks or equivalent per week    Comment: occ   Marital Status: Married  ROS  Review of Systems  Constitutional: Positive for malaise/fatigue.  Cardiovascular: Negative for chest pain, dyspnea on exertion and leg swelling.  Gastrointestinal: Negative for melena.   Objective  Blood pressure 117/75, pulse 69, temperature 98.3 F (36.8 C), temperature source Temporal, resp. rate 16, height 6'  (1.829 m), weight 226 lb 3.2 oz (102.6 kg), SpO2 95 %.  Vitals with BMI 11/19/2020 03/19/2016 03/19/2016  Height _0  - _1   Weight 226 lbs 3 oz - 244 lbs  BMI 50.27 - 74.1  Systolic 287 867 672  Diastolic 75 79 74  Pulse 69 81 78     Physical Exam Cardiovascular:     Rate and Rhythm: Normal rate and regular rhythm.     Pulses: Intact distal pulses.     Heart sounds: Normal heart sounds. No murmur heard. No gallop.      Comments: No leg edema, no JVD. Pulmonary:     Effort: Pulmonary effort is normal.     Breath sounds: Normal breath sounds.  Abdominal:     General: Bowel sounds are normal.     Palpations: Abdomen is soft.    Laboratory examination:   No results for input(s): NA, K, CL, CO2, GLUCOSE, BUN, CREATININE, CALCIUM, GFRNONAA, GFRAA in the last 8760 hours. CrCl cannot be calculated (Patient's most recent lab result is older than the maximum 21 days allowed.).  CMP Latest Ref Rng & Units 09/22/2009  Glucose 70 - 99 mg/dL 118(H)  BUN 6 - 23 mg/dL 17  Creatinine 0.4 - 1.5 mg/dL 1.1  Sodium 135 - 145 mEq/L 139  Potassium 3.5 - 5.1 mEq/L 4.2  Chloride 96 - 112 mEq/L 109   CBC Latest Ref  Rng & Units 09/22/2009 09/22/2009  WBC 4.0 - 10.5 K/uL 6.7 -  Hemoglobin 13.0 - 17.0 g/dL 14.6 15.0  Hematocrit 39.0 - 52.0 % 43.8 44.0  Platelets 150 - 400 K/uL 204 -    Lipid Panel No results for input(s): CHOL, TRIG, LDLCALC, VLDL, HDL, CHOLHDL, LDLDIRECT in the last 8760 hours. Lipid Panel  No results found for: CHOL, TRIG, HDL, CHOLHDL, VLDL, LDLCALC, LDLDIRECT, LABVLDL   HEMOGLOBIN A1C No results found for: HGBA1C, MPG TSH No results for input(s): TSH in the last 8760 hours.  External labs:   Labs 10/14/2020:  Hb 15.4/HCT 45.9, platelets 212, normal indicis.  Serum glucose 92 mg, potassium 4.3, BUN 13, creatinine 0.9, EGFR 105 mL.  CMP otherwise normal.  Total cholesterol 130, triglycerides 101, HDL 61, LDL 50.  Labs 12/18/2016: TSH normal.  Medications and  allergies  No Known Allergies   Outpatient Medications Prior to Visit  Medication Sig  . Ascorbic Acid (VITAMIN C) 1000 MG tablet Take 1,000 mg by mouth daily.  Marland Kitchen aspirin 81 MG chewable tablet Chew 1 tablet by mouth daily.  . rosuvastatin (CRESTOR) 20 MG tablet Take 20 mg by mouth daily.  . [DISCONTINUED] mirabegron ER (MYRBETRIQ) 50 MG TB24 tablet Take by mouth.  . [DISCONTINUED] tamsulosin (FLOMAX) 0.4 MG CAPS capsule Take 0.4 mg by mouth daily.   No facility-administered medications prior to visit.   Current Meds  Medication Sig  . Ascorbic Acid (VITAMIN C) 1000 MG tablet Take 1,000 mg by mouth daily.  Marland Kitchen aspirin 81 MG chewable tablet Chew 1 tablet by mouth daily.  . rosuvastatin (CRESTOR) 20 MG tablet Take 20 mg by mouth daily.    Radiology:   No results found.   Cardiac Studies:   Echocardiogram 02/21/2016:  Left ventricle cavity is normal in size. Normal global wall motion. Doppler evidence of grade I (impaired) diastolic dysfunction. Calculated EF 56%. Trace mitral regurgitation. Mild tricuspid regurgitation. No evidence of pulmonary hypertension.  Carotid artery duplex 02/21/2016: No hemodynamically significant arterial disease in the internal carotid artery bilaterally. Normal study without any significant plaque burden. Antegrade right vertebral artery flow. Antegrade left vertebral artery flow.   Coronary calcium score 10/22/2020:  Total Agatston score 2369.  MeSA database percentile 92. Left Main: 0 LAD: 729 LCx: 835 RCA: 806 Ascending aorta measures 39 mm.  EKG:     EKG 11/19/2020: Normal sinus rhythm at rate of 56 bpm, left axis deviation, left anterior fascicular block.  Right bundle branch block.  Bifascicular block.  No evidence of ischemia.       Assessment     ICD-10-CM   1. Agatston CAC score, >400  R93.1 PCV MYOCARDIAL PERFUSION WO LEXISCAN    PCV ECHOCARDIOGRAM COMPLETE  2. Coronary artery disease involving native coronary artery of native  heart without angina pectoris  I25.10 EKG 12-Lead    Apo A1 + B + Ratio    Lipoprotein A (LPA)    PCV MYOCARDIAL PERFUSION WO LEXISCAN    PCV ECHOCARDIOGRAM COMPLETE  3. Hypercholesteremia  E78.00   4. Bifascicular block  I45.2     Medications Discontinued During This Encounter  Medication Reason  . tamsulosin (FLOMAX) 0.4 MG CAPS capsule Error  . mirabegron ER (MYRBETRIQ) 50 MG TB24 tablet Error    No orders of the defined types were placed in this encounter.  Orders Placed This Encounter  Procedures  . Apo A1 + B + Ratio  . Lipoprotein A (LPA)  . PCV MYOCARDIAL PERFUSION WO  LEXISCAN    Standing Status:   Future    Standing Expiration Date:   01/19/2021  . EKG 12-Lead  . 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  He has not been active and has not been exercising except doing routine chores around the house or on his yard.  However he has noticed "fatigue".  Otherwise denies chest pain, dyspnea, palpitations, dizziness or syncope.  There is no family history of premature coronary artery disease.  Past medical history significant for hyperlipidemia.   His symptoms may suggest underlying coronary artery disease that is significant.  Coronary calcium score is markedly elevated and in the 95th percentile.  He is on appropriate medical therapy with regard to statins, however I would like to obtain ApoA-I: B ratio and also obtain LPA.  Schedule for a Exercise Nuclear stress test to evaluate for myocardial ischemia. Will schedule for an echocardiogram. Office visit following the work-up/investigations. It was n honor to see Dr. Wilson Farrell today.     Adrian Prows, MD, [facility] 11/19/2020, 12:54 PM Office: [phone]

## 2020-11-20 LAB — LIPOPROTEIN A (LPA): Lipoprotein (a): 29.4 nmol/L (ref <75.0)

## 2020-11-20 LAB — APO A1 + B + RATIO
Apolipo. B/A-1 Ratio: 0.4 ratio (ref 0.0–0.7)
Apolipoprotein A-1: 149 mg/dL (ref 101–178)
Apolipoprotein B: 57 mg/dL (ref <90)

## 2020-12-03 ENCOUNTER — Ambulatory Visit: Payer: Medicare Other

## 2020-12-03 ENCOUNTER — Other Ambulatory Visit: Payer: Self-pay

## 2020-12-03 DIAGNOSIS — I2510 Atherosclerotic heart disease of native coronary artery without angina pectoris: Secondary | ICD-10-CM | POA: Diagnosis not present

## 2020-12-03 DIAGNOSIS — R931 Abnormal findings on diagnostic imaging of heart and coronary circulation: Secondary | ICD-10-CM | POA: Diagnosis not present

## 2020-12-04 ENCOUNTER — Ambulatory Visit: Payer: Medicare Other

## 2020-12-04 DIAGNOSIS — I2510 Atherosclerotic heart disease of native coronary artery without angina pectoris: Secondary | ICD-10-CM

## 2020-12-04 DIAGNOSIS — R931 Abnormal findings on diagnostic imaging of heart and coronary circulation: Secondary | ICD-10-CM | POA: Diagnosis not present

## 2020-12-06 ENCOUNTER — Ambulatory Visit (INDEPENDENT_AMBULATORY_CARE_PROVIDER_SITE_OTHER): Payer: Medicare Other | Admitting: Sports Medicine

## 2020-12-06 ENCOUNTER — Other Ambulatory Visit: Payer: Self-pay

## 2020-12-06 VITALS — BP 110/74 | Ht 72.0 in | Wt 230.0 lb

## 2020-12-06 DIAGNOSIS — I2510 Atherosclerotic heart disease of native coronary artery without angina pectoris: Secondary | ICD-10-CM

## 2020-12-06 DIAGNOSIS — M25571 Pain in right ankle and joints of right foot: Secondary | ICD-10-CM | POA: Diagnosis not present

## 2020-12-06 DIAGNOSIS — R269 Unspecified abnormalities of gait and mobility: Secondary | ICD-10-CM | POA: Insufficient documentation

## 2020-12-06 DIAGNOSIS — G8929 Other chronic pain: Secondary | ICD-10-CM | POA: Diagnosis not present

## 2020-12-06 DIAGNOSIS — M25572 Pain in left ankle and joints of left foot: Secondary | ICD-10-CM | POA: Diagnosis not present

## 2020-12-06 NOTE — Assessment & Plan Note (Signed)
Today he was given sports insoles  If he continues to have swelling he would be a good candidate for custom orthotic in the future

## 2020-12-06 NOTE — Progress Notes (Signed)
Chief complaint sharp right ankle pain frequently and some periodic left ankle pain  Referred by Dr. Shelia Media  Patient is a retired endocrinologist Since retirement he is not in a regular exercise program but frequently does yard work When walking or standing too long he frequently gets a sharp stabbing pain in the lateral side of his right ankle This may happen a couple times each day and quickly resolves The left ankle also has some lateral pain This happens less than 50% of the time but it does on the right It also resolves quickly He does not notice any significant swelling  Past history regarding to his foot and ankle He did have some periodic ankle sprains on the right and high school football and basketball At 1 time in a small evulsion fraction on the right He has had flatfeet pretty much since childhood  Review of systems Denies numbness Denies redness No swelling of other joints  PE Pleasant older M in NAD BP 110/74   Ht 6' (1.829 m)   Wt 230 lb (104.3 kg)   BMI 31.19 kg/m  No flowsheet data found.  Right ankle There is some obvious swelling in the right sinus tarsi On standing he has navicular drop  valgus rotation of his midfoot and forefoot Loss of longitudinal arch Ligaments are stable Range of motion is normal There is some crepitation with plantarflexion inversion and eversion but no associated pain  Left ankle In the sinus tarsi there is a small nodule No tenderness Navicular drop noted Loss of longitudinal arch Mild valgus rotation of the midfoot and forefoot Normal range of motion Stable ligaments  Ultrasound examination Right ankle shows a significant effusion from the lateral ankle joint extending into the sinus tarsi There is some mild irregularity of the lateral right talar dome There is some limited spurring Tibiotalar joint space is narrowed Peroneal and other tendons intact  Left ankle shows a small cystic structure in the left sinus  tarsi Some very slight irregularity of the lateral talar dome No significant spurring Some loss of joint space in the tibiotalar articulation Peroneal tendons intact

## 2020-12-06 NOTE — Assessment & Plan Note (Signed)
We will check x-rays of both ankles to be sure there is not significant arthritis I suggested a compression sleeve We added sports insoles to give him some mild arch support with cushioning Hopefully this will reduce the valgus change at his midfoot that creates pressure over the sinus tarsi Icing and topical medications might be helpful

## 2020-12-24 ENCOUNTER — Other Ambulatory Visit: Payer: Self-pay

## 2020-12-24 ENCOUNTER — Encounter: Payer: Self-pay | Admitting: Cardiology

## 2020-12-24 ENCOUNTER — Ambulatory Visit: Payer: Medicare Other | Admitting: Cardiology

## 2020-12-24 VITALS — BP 110/75 | HR 80 | Temp 98.3°F | Resp 16 | Ht 72.0 in | Wt 224.8 lb

## 2020-12-24 DIAGNOSIS — R931 Abnormal findings on diagnostic imaging of heart and coronary circulation: Secondary | ICD-10-CM | POA: Diagnosis not present

## 2020-12-24 DIAGNOSIS — I452 Bifascicular block: Secondary | ICD-10-CM

## 2020-12-24 DIAGNOSIS — I2510 Atherosclerotic heart disease of native coronary artery without angina pectoris: Secondary | ICD-10-CM | POA: Diagnosis not present

## 2020-12-24 DIAGNOSIS — E7800 Pure hypercholesterolemia, unspecified: Secondary | ICD-10-CM

## 2020-12-24 NOTE — Progress Notes (Signed)
Primary Physician/Referring:  Deland Pretty, MD  Patient ID: Willie Farrell, male    DOB: 1946-03-03, 75 y.o.   MRN: [id number]  No chief complaint on file.  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  He has not been active and has not been exercising except doing routine chores around the house or on his yard.  However he has noticed "fatigue".  Otherwise denies chest pain, dyspnea, palpitations, dizziness or syncope.  There is no family history of premature coronary artery disease.  Past medical history significant for hyperlipidemia.  He underwent echocardiogram and nuclear stress test and presents for follow-up.  No new symptomatology.  Past Medical History:  Diagnosis Date  . Actinic keratoses   . BPH (benign prostatic hyperplasia)   . CAD (coronary artery disease)   . Hypercholesteremia   . Vitamin D deficiency    Past Surgical History:  Procedure Laterality Date  . APPENDECTOMY    . TONSILLECTOMY AND ADENOIDECTOMY     Family History  Problem Relation Age of Onset  . Diabetes Father   . Heart attack Father 7    Social History   Tobacco Use  . Smoking status: Former Smoker    Years: 2.00    Types: Cigarettes    Quit date: 11/19/1968    Years since quitting: 52.1  . Smokeless tobacco: Never Used  . Tobacco comment: only smoked in college  Substance Use Topics  . Alcohol use: Yes    Alcohol/week: 2.0 standard drinks    Types: 2 Standard drinks or equivalent per week    Comment: occ   Marital Status: Married  ROS  Review of Systems  Constitutional: Positive for malaise/fatigue.  Cardiovascular: Negative for chest pain, dyspnea on exertion and leg swelling.  Gastrointestinal: Negative for melena.   Objective  Blood pressure 110/75, pulse 80, temperature 98.3 F (36.8 C), temperature source Temporal, resp. rate 16, height 6'  (1.829 m), weight 224 lb 12.8 oz (102 kg), SpO2 96 %.  Vitals with BMI 12/24/2020 12/06/2020 11/19/2020  Height 6' 0"  6' 0"  6' 0"   Weight 224 lbs 13 oz 230 lbs 226 lbs 3 oz  BMI 30.48 16.07 37.10  Systolic 626 948 546  Diastolic 75 74 75  Pulse 80 - 69     Physical Exam Cardiovascular:     Rate and Rhythm: Normal rate and regular rhythm.     Pulses: Intact distal pulses.     Heart sounds: Normal heart sounds. No murmur heard. No gallop.      Comments: No leg edema, no JVD. Pulmonary:     Effort: Pulmonary effort is normal.     Breath sounds: Normal breath sounds.  Abdominal:     General: Bowel sounds are normal.     Palpations: Abdomen is soft.    Laboratory examination:    External labs:   Labs 10/14/2020:  Hb 15.4/HCT 45.9, platelets 212, normal indicis.  Serum glucose 92 mg, potassium 4.3, BUN 13, creatinine 0.9, EGFR 105 mL.  CMP otherwise normal.  Total cholesterol 130, triglycerides 101, HDL 61, LDL 50.  Labs 12/18/2016: TSH normal.  Medications and allergies  No Known Allergies   Outpatient Medications Prior to Visit  Medication Sig  . Ascorbic Acid (VITAMIN C) 1000 MG tablet Take 1,000 mg by mouth daily.  Marland Kitchen aspirin 81 MG chewable tablet Chew 1 tablet by  mouth daily.  . rosuvastatin (CRESTOR) 20 MG tablet Take 20 mg by mouth daily.   No facility-administered medications prior to visit.   Current Meds  Medication Sig  . Ascorbic Acid (VITAMIN C) 1000 MG tablet Take 1,000 mg by mouth daily.  Marland Kitchen aspirin 81 MG chewable tablet Chew 1 tablet by mouth daily.  . rosuvastatin (CRESTOR) 20 MG tablet Take 20 mg by mouth daily.    Radiology:   No results found.  Cardiac Studies:   Carotid artery duplex 05-Mar-2016: No hemodynamically significant arterial disease in the internal carotid artery bilaterally. Normal study without any significant plaque burden. Antegrade right vertebral artery flow. Antegrade left vertebral artery flow.   Coronary calcium score  10/22/2020:  Total Agatston score 2369.  MeSA database percentile 92. Left Main: 0 LAD: 729 LCx: 835 RCA: 806 Ascending aorta measures 39 mm.  Lexiscan Tetrofosmin stress test 12/03/2020: Lexiscan nuclear stress test performed using 1-day protocol. SPECT images show small sized, mild intensity perfusion defect on rest and stress images, with no significant reversibility. In absence of wall motion abnormality, the changes likely suggest tissue attenuation, but ischemia cannot be excluded. Clinical correlation recommended. Stress LVEF 55%.  Echocardiogram 12/04/2020: Normal LV systolic function with visual EF 55-60%. Left ventricle cavity is normal in size. Normal global wall motion. Normal diastolic filling pattern, normal LAP.  Mild tricuspid regurgitation. No evidence of pulmonary hypertension. Compared to prior study dated 03-05-2016: no significant change.   EKG:     EKG 11/19/2020: Normal sinus rhythm at rate of 56 bpm, left axis deviation, left anterior fascicular block.  Right bundle branch block.  Bifascicular block.  No evidence of ischemia.       Assessment     ICD-10-CM   1. Coronary artery disease involving native coronary artery of native heart without angina pectoris  I25.10   2. Agatston CAC score, >400  R93.1   3. Hypercholesteremia  E78.00   4. Bifascicular block  I45.2     There are no discontinued medications.  No orders of the defined types were placed in this encounter.  No orders of the defined types were placed in this encounter.  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  He has not been active and has not been exercising except doing routine chores around the house or on his yard.  However he has noticed "fatigue".  Otherwise denies chest pain, dyspnea, palpitations, dizziness or syncope.  There is no family history of  premature coronary artery disease.  Past medical history significant for hyperlipidemia.  In view of markedly elevated coronary calcium score and fatigue to be potential anginal:, He was scheduled for a treadmill stress test however was unable to access the treadmill due to ankle injury/arthritis and underwent nuclear stress test using Lexiscan.  I reviewed the results of the stress test, he is normal perfusion and no evidence of ischemia.  Also reviewed the results of the echocardiogram revealing normal wall motion, no significant valvular abnormality, no aortic valve or aortic root calcification.  I again reviewed with him that his risk factors are well controlled and he has been losing weight now is lost 6 pounds in the last 6 weeks, advised him to get his BMI to around 28-29.  Otherwise lipids are under excellent control with the LDL being at goal at 50.  I have reviewed with him regarding the  bifascicular block that he has, discussions regarding potential development of high degree AV block also discussed.  Today I did not make any changes to his medications, I will see him back on annual basis.  In the absence of angina pectoris, normal nuclear stress test, I did not use a beta-blocker therapy as well also due to underlying bifascicular block.  Again I did not use any ACE inhibitor's although he has vascular disease in view of the fact that his blood pressure is very soft.    Adrian Prows, MD, [facility] 12/24/2020, 4:28 PM Office: [phone]

## 2020-12-26 DIAGNOSIS — N401 Enlarged prostate with lower urinary tract symptoms: Secondary | ICD-10-CM | POA: Diagnosis not present

## 2020-12-26 DIAGNOSIS — R972 Elevated prostate specific antigen [PSA]: Secondary | ICD-10-CM | POA: Diagnosis not present

## 2020-12-26 DIAGNOSIS — R3915 Urgency of urination: Secondary | ICD-10-CM | POA: Diagnosis not present

## 2020-12-31 ENCOUNTER — Ambulatory Visit: Payer: Medicare Other | Admitting: Cardiology

## 2021-01-01 ENCOUNTER — Other Ambulatory Visit: Payer: Self-pay | Admitting: Urology

## 2021-01-01 DIAGNOSIS — R972 Elevated prostate specific antigen [PSA]: Secondary | ICD-10-CM

## 2021-01-23 ENCOUNTER — Ambulatory Visit
Admission: RE | Admit: 2021-01-23 | Discharge: 2021-01-23 | Disposition: A | Discharge status: Home / Self Care | Payer: Medicare Other | Source: Ambulatory Visit | Attending: Urology | Admitting: Urology

## 2021-01-23 ENCOUNTER — Other Ambulatory Visit: Payer: Self-pay

## 2021-01-23 DIAGNOSIS — N400 Enlarged prostate without lower urinary tract symptoms: Secondary | ICD-10-CM | POA: Diagnosis not present

## 2021-01-23 DIAGNOSIS — R972 Elevated prostate specific antigen [PSA]: Secondary | ICD-10-CM | POA: Diagnosis not present

## 2021-01-23 DIAGNOSIS — R590 Localized enlarged lymph nodes: Secondary | ICD-10-CM | POA: Diagnosis not present

## 2021-01-23 DIAGNOSIS — N402 Nodular prostate without lower urinary tract symptoms: Secondary | ICD-10-CM | POA: Diagnosis not present

## 2021-01-23 DIAGNOSIS — C61 Malignant neoplasm of prostate: Secondary | ICD-10-CM

## 2021-01-23 HISTORY — DX: Malignant neoplasm of prostate: C61

## 2021-01-23 MED ORDER — GADOBENATE DIMEGLUMINE 529 MG/ML IV SOLN
20.0000 mL | Freq: Once | INTRAVENOUS | Status: AC | PRN
  Administered 2021-01-23: 20 mL via INTRAVENOUS

## 2021-02-19 DIAGNOSIS — R972 Elevated prostate specific antigen [PSA]: Secondary | ICD-10-CM | POA: Diagnosis not present

## 2021-02-19 DIAGNOSIS — C61 Malignant neoplasm of prostate: Secondary | ICD-10-CM | POA: Diagnosis not present

## 2021-03-05 ENCOUNTER — Encounter: Payer: Self-pay | Admitting: Medical Oncology

## 2021-03-11 DIAGNOSIS — M546 Pain in thoracic spine: Secondary | ICD-10-CM | POA: Diagnosis not present

## 2021-03-11 DIAGNOSIS — M549 Dorsalgia, unspecified: Secondary | ICD-10-CM | POA: Diagnosis not present

## 2021-03-12 ENCOUNTER — Encounter: Payer: Self-pay | Admitting: Medical Oncology

## 2021-03-12 NOTE — Progress Notes (Signed)
Attempted to reach patient patient to discuss referral to the [facility] 7/26. I mailed a packet of information on the clinic 7/12. I will try to reach at a later date.

## 2021-03-13 ENCOUNTER — Other Ambulatory Visit: Payer: Self-pay | Admitting: Internal Medicine

## 2021-03-13 DIAGNOSIS — M545 Low back pain, unspecified: Secondary | ICD-10-CM

## 2021-03-13 DIAGNOSIS — M9902 Segmental and somatic dysfunction of thoracic region: Secondary | ICD-10-CM | POA: Diagnosis not present

## 2021-03-13 DIAGNOSIS — M5416 Radiculopathy, lumbar region: Secondary | ICD-10-CM | POA: Diagnosis not present

## 2021-03-13 DIAGNOSIS — M9903 Segmental and somatic dysfunction of lumbar region: Secondary | ICD-10-CM | POA: Diagnosis not present

## 2021-03-13 DIAGNOSIS — M5414 Radiculopathy, thoracic region: Secondary | ICD-10-CM | POA: Diagnosis not present

## 2021-03-18 ENCOUNTER — Encounter: Payer: Self-pay | Admitting: Medical Oncology

## 2021-03-18 NOTE — Progress Notes (Signed)
Spoke with patient to confirm appointment for [facility] 7/26. Reviewed registration, COVID protocol and reminded him to bring completed medical forms.

## 2021-03-19 ENCOUNTER — Encounter: Payer: Self-pay | Admitting: Medical Oncology

## 2021-03-19 ENCOUNTER — Ambulatory Visit
Admission: RE | Admit: 2021-03-19 | Discharge: 2021-03-19 | Disposition: A | Discharge status: Home / Self Care | Payer: Medicare Other | Source: Ambulatory Visit | Attending: Radiation Oncology | Admitting: Radiation Oncology

## 2021-03-19 ENCOUNTER — Inpatient Hospital Stay: Payer: Medicare Other | Attending: Oncology | Admitting: Oncology

## 2021-03-19 ENCOUNTER — Encounter: Payer: Self-pay | Admitting: General Practice

## 2021-03-19 ENCOUNTER — Other Ambulatory Visit: Payer: Self-pay

## 2021-03-19 DIAGNOSIS — Z7982 Long term (current) use of aspirin: Secondary | ICD-10-CM | POA: Insufficient documentation

## 2021-03-19 DIAGNOSIS — R350 Frequency of micturition: Secondary | ICD-10-CM | POA: Diagnosis not present

## 2021-03-19 DIAGNOSIS — C61 Malignant neoplasm of prostate: Secondary | ICD-10-CM | POA: Insufficient documentation

## 2021-03-19 DIAGNOSIS — E559 Vitamin D deficiency, unspecified: Secondary | ICD-10-CM | POA: Insufficient documentation

## 2021-03-19 DIAGNOSIS — E7800 Pure hypercholesterolemia, unspecified: Secondary | ICD-10-CM | POA: Insufficient documentation

## 2021-03-19 DIAGNOSIS — M5416 Radiculopathy, lumbar region: Secondary | ICD-10-CM | POA: Diagnosis not present

## 2021-03-19 DIAGNOSIS — Z87891 Personal history of nicotine dependence: Secondary | ICD-10-CM | POA: Insufficient documentation

## 2021-03-19 DIAGNOSIS — R351 Nocturia: Secondary | ICD-10-CM

## 2021-03-19 DIAGNOSIS — I2510 Atherosclerotic heart disease of native coronary artery without angina pectoris: Secondary | ICD-10-CM | POA: Insufficient documentation

## 2021-03-19 DIAGNOSIS — M9902 Segmental and somatic dysfunction of thoracic region: Secondary | ICD-10-CM | POA: Diagnosis not present

## 2021-03-19 DIAGNOSIS — M5414 Radiculopathy, thoracic region: Secondary | ICD-10-CM | POA: Diagnosis not present

## 2021-03-19 DIAGNOSIS — Z79899 Other long term (current) drug therapy: Secondary | ICD-10-CM | POA: Insufficient documentation

## 2021-03-19 DIAGNOSIS — M9903 Segmental and somatic dysfunction of lumbar region: Secondary | ICD-10-CM | POA: Diagnosis not present

## 2021-03-19 NOTE — Progress Notes (Signed)
Radiation Oncology         [phone] ________________________________  Multidisciplinary Prostate Cancer Clinic  Initial Radiation Oncology Consultation  Name: Willie Farrell MRN: [id number]  Date: 03/19/2021  DOB: [birth year]  [id number], Thayer Jew, MD  Raynelle Bring, MD   REFERRING PHYSICIAN: Raynelle Bring, MD  DIAGNOSIS: 75 y.o. gentleman with stage T1c adenocarcinoma of the prostate with a Gleason's score of 3+3 and a PSA of 6.1    ICD-10-CM   1. Malignant neoplasm of prostate ([place])  C61       HISTORY OF PRESENT ILLNESS:: Dr. 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 He was previously followed by Dr. Rosana Hoes for BPH with LUTS and elevated PSA but no previous prostate biopsies. His most recent PSA from 08/2020 was 6.1. He transferred his care to Dr. Alinda Money on 12/26/20,  digital rectal examination was performed at that time revealing some asymmetry with right-sided firmness. He underwent prostate MRI on 01/23/21 showing and 13 mm PI-RADS 4 lesion in the anterior left peripheral zone spanning the mid gland and apex, as well as a linear lesion in the right peripheral zone, favored post inflammatory scarring/fibrosis (PI-RADS 2) on report but on review with the radiologist during conference, this was felt to more likely represent a PI-RADS 4 lesion.there was no lymphadenopathy or concerning disease elsewhere in the pelvis.  The patient proceeded to MRI fusion biopsies of the prostate on 02/19/21.  The prostate volume measured 31.2 cc.  Out of 16 core biopsies, 6 were positive.  All 4 samples from the ROI lesion on MRI were benign.  The maximum Gleason score was 3+3, and this was seen in the left mid lateral (with perineural invasion), left apex lateral, left mid, left apex, right mid, and right base lateral.     Oncotype DX testing on the biopsy sample is pending.  The patient reviewed the biopsy results with his urologist and he has kindly been referred  today to the multidisciplinary prostate cancer clinic for presentation of pathology and radiology studies in our conference for discussion of potential radiation treatment options and clinical evaluation.   PREVIOUS RADIATION THERAPY: No  PAST MEDICAL HISTORY:  has a past medical history of Actinic keratoses, BPH (benign prostatic hyperplasia), CAD (coronary artery disease), Hypercholesteremia, and Vitamin D deficiency.    PAST SURGICAL HISTORY: Past Surgical History:  Procedure Laterality Date   APPENDECTOMY     TONSILLECTOMY AND ADENOIDECTOMY      FAMILY HISTORY: family history includes Diabetes in his father; Heart attack (age of onset: 66) in his father.  SOCIAL HISTORY:  reports that he quit smoking about 52 years ago. His smoking use included cigarettes. He has never used smokeless tobacco. He reports current alcohol use of about 2.0 standard drinks of alcohol per week. He reports that he does not use drugs.  ALLERGIES: Patient has no known allergies.  MEDICATIONS:  Current Outpatient Medications  Medication Sig Dispense Refill   Ascorbic Acid (VITAMIN C) 1000 MG tablet Take 1,000 mg by mouth daily.     aspirin 81 MG chewable tablet Chew 1 tablet by mouth daily.     rosuvastatin (CRESTOR) 20 MG tablet Take 20 mg by mouth daily.     No current facility-administered medications for this encounter.    REVIEW OF SYSTEMS:  On review of systems, the patient reports that he is doing well overall. He denies any chest pain, shortness of breath, cough, fevers, chills, night sweats, unintended weight changes.  He denies any bowel disturbances, and denies abdominal pain, nausea or vomiting. He denies any new musculoskeletal or joint aches or pains. His IPSS was 18 in the urology office, indicating severe urinary symptoms with frequency, urgency and nocturia as his main complaints.  He reports that he does have moderate-severe erectile dysfunction. A complete review of systems is obtained and  is otherwise negative.   PHYSICAL EXAM:  Wt Readings from Last 3 Encounters:  12/24/20 224 lb 12.8 oz (102 kg)  12/06/20 230 lb (104.3 kg)  11/19/20 226 lb 3.2 oz (102.6 kg)   Temp Readings from Last 3 Encounters:  12/24/20 98.3 F (36.8 C) (Temporal)  11/19/20 98.3 F (36.8 C) (Temporal)   BP Readings from Last 3 Encounters:  12/24/20 110/75  12/06/20 110/74  11/19/20 117/75   Pulse Readings from Last 3 Encounters:  12/24/20 80  11/19/20 69  03/19/16 81    /10  In general this is a well appearing Caucasian male in no acute distress. He's alert and oriented x4 and appropriate throughout the examination. Cardiopulmonary assessment is negative for acute distress and he exhibits normal effort.    KPS = 100  100 - Normal; no complaints; no evidence of disease. 90   - Able to carry on normal activity; minor signs or symptoms of disease. 80   - Normal activity with effort; some signs or symptoms of disease. 64   - Cares for self; unable to carry on normal activity or to do active work. 60   - Requires occasional assistance, but is able to care for most of his personal needs. 50   - Requires considerable assistance and frequent medical care. 34   - Disabled; requires special care and assistance. 66   - Severely disabled; hospital admission is indicated although death not imminent. 65   - Very sick; hospital admission necessary; active supportive treatment necessary. 10   - Moribund; fatal processes progressing rapidly. 0     - Dead  Karnofsky DA, Abelmann [place], Craver LS and Burchenal [facility] [phone]) The use of the nitrogen mustards in the palliative treatment of carcinoma: with particular reference to bronchogenic carcinoma Cancer 1 634-56   LABORATORY DATA:  Lab Results  Component Value Date   WBC 6.7 09/22/2009   HGB 14.6 09/22/2009   HCT 43.8 09/22/2009   MCV 91.3 09/22/2009   PLT 204 09/22/2009   Lab Results  Component Value Date   NA 139 09/22/2009   K 4.2 09/22/2009    CL 109 09/22/2009   No results found for: ALT, AST, GGT, ALKPHOS, BILITOT   RADIOGRAPHY: No results found.    IMPRESSION/PLAN: 75 y.o. gentleman with Stage T1c adenocarcinoma of the prostate with a Gleason score of 3+3 and a PSA of 6.1.    We discussed the patient's workup and outlined the nature of prostate cancer in this setting. The patient's T stage, Gleason's score, and PSA put him into the low risk group. Accordingly, he is eligible for a variety of potential treatment options including active surveillance, brachytherapy, 5.5 weeks of external radiation, or prostatectomy. We discussed the available radiation techniques, and focused on the details and logistics of delivery. We discussed and outlined the risks, benefits, short and long-term effects associated with radiotherapy and compared and contrasted these with prostatectomy. We discussed the role of SpaceOAR gel in reducing the rectal toxicity associated with radiotherapy. He appears to have a good understanding of his disease and our treatment recommendations which are of curative intent.  He was encouraged to ask questions that were answered to his stated satisfaction.  At the end of the conversation the patient is undecided but appears to be leaning towards active surveillance pending there is no indication for any more aggressive disease on the Oncotype DX score. We will share our discussion with Dr. Alinda Money and look forward to following along in the care of this very nice gentleman.  Of course, we are more than happy to continue to participate in his care if indicated in the future and he appears to prefer brachytherapy as his treatment modality of choice for when the time comes.     Nicholos Johns, PA-C    Tyler Pita, MD  [facility] Direct Dial: [phone]  Fax: [phone] [email or web address]  Skype  [email or web address]   This document serves as a record of services personally performed by Tyler Pita,  MD and Freeman Caldron, PA-C. It was created on their behalf by Wilburn Mylar, a trained medical scribe. The creation of this record is based on the scribe's personal observations and the provider's statements to them. This document has been checked and approved by the attending provider.

## 2021-03-19 NOTE — Progress Notes (Signed)
[facility] Psychosocial Distress Screening Spiritual Care  Met with Willie "[name]" and his wife [name] in [facility] to introduce [facility] team/resources, reviewing distress screen per protocol.  The patient scored a 2 on the Psychosocial Distress Thermometer which indicates mild distress. Also assessed for distress and other psychosocial needs.   ONCBCN DISTRESS SCREENING 03/19/2021  Screening Type Initial Screening  Distress experienced in past week (1-10) 2  Spiritual/Religous concerns type Facing my mortality  Information Concerns Type Lack of info about complementary therapy choices  Physical Problem type Pain;Changes in urination  Referral to support programs Yes   Provided rapport-building, introduction to [facility] team and support programming, empathic listening, and normalization of feelings.   Follow up needed: No. Dr Wilson Singer prefers to reach out as needed/desired.   [place], [place], [facility] Pager [phone] Voicemail [phone]

## 2021-03-19 NOTE — Consult Note (Signed)
Multi-Disciplinary Clinic     03/19/2021   --------------------------------------------------------------------------------   Willie Farrell  MRN: [id number]  DOB: Feb 21, 1946, 75 year old Male  SSN:    PRIMARY CARE:  Deland Pretty, MD  REFERRING:  Raynelle Bring, Eduardo Osier  PROVIDER:  Raynelle Bring, M.D.  LOCATION:  [facility] [phone]     --------------------------------------------------------------------------------   CC/HPI: CC: Prostate Cancer   PCP: Dr. 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 He underwent an MRI of the prostate on 01/23/21 indicated a PI-RADS 4 lesion at the left apex/mid gland. An MR/US biopsy of the prostate on 02/19/21 indicated 6 out of 16 biopsy cores positive for Gleason 3+3=6 adenocarcinoma with all targeted biopsies negative for malignancy.   Family history: None.   Imaging studies: None.   PMH: He has a history of CAD, depression, GERD, and hyperlipidemia.  PSH: Appendectomy.   TNM stage: cT1c Nx Mx  PSA: 6.1  Gleason score: 3+3=6 (GG 1)  Biopsy (02/19/21): 6/16 cores positive  Left: L lateral apex (30%, 3+3=6), L apex (30%, 3+3=6), L lateral mid (10%, 3+3=6), L mid (10%, 3+3=6)  Right: R mid (10%, 3+3=6), R lateral base (20%, 3+3=6)  Targeted: Benign  Prostate volume: 31.2 cc  PSAD: 0.20   Nomogram  OC disease: 60%  EPE: 35%  SVI: 2%  LNI: 2%  PFS (5 year, 10 year): 92%, 85%   Urinary function: IPSS is 18. He did try Gemtesa and noticed 30% improvement especially with urgency but found it too expensive ($600 per month).  Erectile function: SHIM score is .     ALLERGIES: nkda    MEDICATIONS: Aspirin 81 mg  tablet,chewable  Crestor 20 mg tablet  Diazepam 10 mg tablet Take 10 mg 30-60 minutes prior to your procedure  Gemtesa 75 mg tablet 1 tablet PO Daily  Levofloxacin 750 mg tablet Please take one tablet the morning of your biopsy.     GU PSH: Cystoscopy Ureteroscopy - 2020 Prostate Needle Biopsy - 02/19/2021     NON-GU PSH: Appendectomy Remove Adenoids Surgical Pathology, Gross And Microscopic Examination For Prostate Needle - 02/19/2021 Tonsillectomy     GU PMH: BPH w/LUTS - 12/26/2020 Urinary Urgency - 12/26/2020      PMH Notes:   1) Prostate cancer: He presented to me in May 2022 at age 35 and his PSA was noted to be elevated at 6.1. An MRI of the prostate in June 2022 indicated a left mid/apical PI-RADS 4 lesion. He underwent an MR/US biopsy of the prostate on 02/19/21 indicated 6 out of 12 biopsy cores positive for Gleason 3+3=6 adenocarcinoma. The targeted biopsies were negative for malignancy   Initial diagnosis: June 2022  TNM stage: cT1c Nx Mx  PSA at diagnosis: 6.1  Gleason score: 3+3=6 (GG 1)  Biopsy (02/19/21): 6/16 cores positive  Left: L lateral apex (30%, 3+3=6), L apex (30%, 3+3=6), L lateral mid (10%, 3+3=6), L mid (10%, 3+3=6)  Right: R mid (10%, 3+3=6), R lateral base (20%, 3+3=6)  Targeted: Benign  Prostate volume: 31.2 cc  PSAD: 0.20   2) BPH/LUTS: His baseline symptoms included urgency, frequency, sense of incomplete emptying, and nocturia 4 times per  night. Baseline IPSS was 18.   Prior treatment: Alpha blockers (ineffective), Myrbetriq (only 10% improvement and not worth the cost)   3) Abnormal cytology: He was noted to have a suspicious voided cytology in January 2020. The reason for this test is unclear. He had a repeat cytology that was also suspicious and was taken to the OR by Dr. Lawerance Bach and underwent cystoscopy with bilateral retrograde pyelography, ureteroscopy, and selective cytologies that were negative for malignancy. Left ureteroscopy was  performed due to a questionable filling defect and was also normal.      NON-GU PMH: Actinic keratosis Coronary Artery Disease Depression GERD Hypercholesterolemia Pain in left ankle and joints of left foot Unspecified abnormalities of gait and mobility    FAMILY HISTORY: 3 Son's - Runs in Family Myocardial Infarction - Father   SOCIAL HISTORY: Marital Status: Married Ethnicity: Not Hispanic Or Latino; Race: White Current Smoking Status: Patient does not smoke anymore. Has not smoked since 11/19/1968.   Tobacco Use Assessment Completed: Used Tobacco in last 30 days? Does drink.  Drinks 2 caffeinated drinks per day.    REVIEW OF SYSTEMS:    GU Review Male:   Patient denies frequent urination, hard to postpone urination, burning/ pain with urination, get up at night to urinate, leakage of urine, stream starts and stops, trouble starting your streams, and have to strain to urinate .  Gastrointestinal (Upper):   Patient denies nausea and vomiting.  Gastrointestinal (Lower):   Patient denies diarrhea and constipation.  Constitutional:   Patient denies fever, night sweats, weight loss, and fatigue.  Skin:   Patient denies skin rash/ lesion and itching.  Eyes:   Patient denies blurred vision and double vision.  Ears/ Nose/ Throat:   Patient denies sore throat and sinus problems.  Hematologic/Lymphatic:   Patient denies swollen glands and easy bruising.  Cardiovascular:   Patient denies leg swelling and chest pains.  Respiratory:   Patient denies cough and shortness of breath.  Endocrine:   Patient denies excessive thirst.  Musculoskeletal:   Patient denies back pain and joint pain.  Neurological:   Patient denies headaches and dizziness.  Psychologic:   Patient denies depression and anxiety.   VITAL SIGNS: None   MULTI-SYSTEM PHYSICAL EXAMINATION:    Constitutional: Well-nourished. No physical deformities. Normally developed. Good grooming.     Complexity of Data:  Lab Test  Review:   PSA  Records Review:   Pathology Reports, Previous Patient Records   PROCEDURES: None   ASSESSMENT:      ICD-10 Details  1 GU:   Prostate Cancer - C61   2   BPH w/LUTS - N40.1   3   Urinary Urgency - R39.15    PLAN:           Document Letter(s):  Created for Patient: Clinical Summary         Notes:   1. Low risk prostate cancer: [name] is very well informed through his prior discussions this afternoon with Dr. Alen Blew and Dr. Tammi Klippel. I also had a detailed discussion with him and his wife regarding his situation. The patient was counseled about the natural history of prostate cancer and the standard treatment options that are available for prostate cancer. It was explained to him how his age and life expectancy, clinical stage, Gleason score, and PSA affect his prognosis, the decision to proceed with additional staging studies, as well as how that information influences recommended treatment strategies. We discussed the roles for active surveillance,  radiation therapy, surgical therapy, androgen deprivation, as well as ablative therapy options for the treatment of prostate cancer as appropriate to his individual cancer situation. We discussed the risks and benefits of these options with regard to their impact on cancer control and also in terms of potential adverse events, complications, and impact on quality of life particularly related to urinary and sexual function. The patient was encouraged to ask questions throughout the discussion today and all questions were answered to his stated satisfaction. In addition, the patient was provided with and/or directed to appropriate resources and literature for further education about prostate cancer and treatment options.   Currently, he is most interested in proceeding with active surveillance considering his low risk disease. His biopsy pathology has been sent for Oncotype DX testing. This may further impact his decision. We will await these  results but will tentatively plan for an office visit in 6 months with a PSA/DRE at that time. Assuming that his uncle type DX testing is confirmatory of his low risk disease, we will proceed in this fashion and will consider a repeat biopsy and likely repeat MRI sometime over the next 2 years.   2. LUTS: He did receive significant benefit with Gemtesa. However, this was not worth the cost of the medication. We did discuss anticholinergic medication today but after further discussion, he does not wish to proceed with additional therapy and feels that his symptoms, although somewhat bothersome, are manageable.   3. History of abnormal cytology: We will continue to monitor his urinalyses for hematuria.   Cc: Dr. Deland Pretty  Dr. Zola Button  Dr. Tyler Pita     E & M CODES: We spent 34 minutes dedicated to evaluation and management time, including face to face interaction, discussions on coordination of care, documentation, result review, and discussion with others as applicable.

## 2021-03-19 NOTE — Progress Notes (Addendum)
Reason for the request:    Prostate cancer  HPI: I was asked by Dr. Alinda Money to evaluate Dr. Wilson Singer for the evaluation of prostate cancer.  He is a 75 year old man without any significant comorbid conditions was found to have elevated PSA and has been followed by Dr. Rosana Hoes.  His PSA in January 2021 was 5.67 and increased to 6.1 in January 2022.  He has subsequently underwent MRI in June 2022 which showed a PI-RADS 4 suspicious lesion in the left peripheral zone.  He underwent a biopsy which showed a Gleason score 3+3 equal 6 well differentiated cancer in 6 cores and his targeted lesion was negative.  Clinically he does report urinary frequency and nocturia but otherwise no other complaints.  He is currently retired but remains reasonably active.  He does report some periods of fatigue and tiredness and does require periodically.  He denies any bone pain or pathological fractures.  He does not report any headaches, blurry vision, syncope or seizures. Does not report any fevers, chills or sweats.  Does not report any cough, wheezing or hemoptysis.  Does not report any chest pain, palpitation, orthopnea or leg edema.  Does not report any nausea, vomiting or abdominal pain.  Does not report any constipation or diarrhea.  Does not report any skeletal complaints.    Does not report frequency, urgency or hematuria.  Does not report any skin rashes or lesions. Does not report any heat or cold intolerance.  Does not report any lymphadenopathy or petechiae.  Does not report any anxiety or depression.  Remaining review of systems is negative.     Past Medical History:  Diagnosis Date   Actinic keratoses    BPH (benign prostatic hyperplasia)    CAD (coronary artery disease)    Hypercholesteremia    Vitamin D deficiency   :   Past Surgical History:  Procedure Laterality Date   APPENDECTOMY     TONSILLECTOMY AND ADENOIDECTOMY    :   Current Outpatient Medications:    Ascorbic Acid (VITAMIN C) 1000 MG  tablet, Take 1,000 mg by mouth daily., Disp: , Rfl:    aspirin 81 MG chewable tablet, Chew 1 tablet by mouth daily., Disp: , Rfl:    rosuvastatin (CRESTOR) 20 MG tablet, Take 20 mg by mouth daily., Disp: , Rfl: :  No Known Allergies:   Family History  Problem Relation Age of Onset   Diabetes Father    Heart attack Father 82  :   Social History   Socioeconomic History   Marital status: Married    Spouse name: Not on file   Number of children: 3   Years of education: MD   Highest education level: Not on file  Occupational History   Occupation: [employer]  Tobacco Use   Smoking status: Former    Years: 2.00    Types: Cigarettes    Quit date: 11/19/1968    Years since quitting: 52.3   Smokeless tobacco: Never   Tobacco comments:    only smoked in college  Vaping Use   Vaping Use: Never used  Substance and Sexual Activity   Alcohol use: Yes    Alcohol/week: 2.0 standard drinks    Types: 2 Standard drinks or equivalent per week    Comment: occ   Drug use: No   Sexual activity: Not on file  Other Topics Concern   Not on file  Social History Narrative   Drinks 2 cups of coffee a day  Social Determinants of Health   Financial Resource Strain: Not on file  Food Insecurity: Not on file  Transportation Needs: Not on file  Physical Activity: Not on file  Stress: Not on file  Social Connections: Not on file  Intimate Partner Violence: Not on file  :  Pertinent items are noted in HPI.     Assessment and Plan:    75 year old man with prostate cancer diagnosed with a PSA of 6.1 and a biopsy that showed a Gleason score 3+3 equal 6 with well differentiated glands in 6 cores.  His case was discussed today at the prostate cancer multidisciplinary clinic and treatment options were reviewed.  Imaging studies including his MRI were reviewed by radiology and pathology results were discussed with the reviewing pathologist the natural course of this disease was  reviewed today in detail as well as the natural course of this disease and risk of cancer progression and pattern metastasis associated with prostate cancer.  He understands he still has low risk disease and technically qualifies for active surveillance.  Definitive therapy with radiation versus surgery was also discussed as an alternative treatment option.  Given his age and other comorbidities primary surgical therapy may be less preferred if he opts to proceed with active treatment.  Treatment for metastatic disease with systemic treatment was discussed today.  These options including oral targeted therapy, systemic chemotherapy radiopharmaceutical agents were reviewed.  He understands these options are very successful in palliating disease but not curative.  He will consider these options and will make a decision in the near future.  All his questions were answered to his satisfaction.  45  minutes were dedicated to this visit. The time was spent on reviewing laboratory data, imaging studies, discussing treatment options,  and answering questions regarding future plan.     A copy of this consult has been forwarded to the requesting physician.

## 2021-03-19 NOTE — Progress Notes (Signed)
                               Care Plan Summary  Name: Dr. Gareth Eagle DOB: 1945/10/07   Your Medical Team:   Urologist -  Dr. Raynelle Bring, [facility]  Radiation Oncologist - Dr. Tyler Pita, [facility]   Medical Oncologist - Dr. Zola Button, [place]  Recommendations: 1) Active surveillance      * These recommendations are based on information available as of today's consult.      Recommendations may change depending on the results of further tests or exams.  Next Steps: 1) Continue follow up with Dr. Alinda Money   When appointments need to be scheduled, you will be contacted by [facility] and/or [facility].  Questions?  Please do not hesitate to call Cira Rue, RN, BSN, OCN at [phone] any questions or concerns.  Shirlean Mylar is your Oncology Nurse Navigator and is available to assist you while you're receiving your medical care at [facility].

## 2021-03-20 DIAGNOSIS — M9903 Segmental and somatic dysfunction of lumbar region: Secondary | ICD-10-CM | POA: Diagnosis not present

## 2021-03-20 DIAGNOSIS — M5414 Radiculopathy, thoracic region: Secondary | ICD-10-CM | POA: Diagnosis not present

## 2021-03-20 DIAGNOSIS — M5416 Radiculopathy, lumbar region: Secondary | ICD-10-CM | POA: Diagnosis not present

## 2021-03-20 DIAGNOSIS — M9902 Segmental and somatic dysfunction of thoracic region: Secondary | ICD-10-CM | POA: Diagnosis not present

## 2021-04-09 DIAGNOSIS — U071 COVID-19: Secondary | ICD-10-CM | POA: Diagnosis not present

## 2021-07-10 DIAGNOSIS — H2513 Age-related nuclear cataract, bilateral: Secondary | ICD-10-CM | POA: Diagnosis not present

## 2021-07-24 DIAGNOSIS — Z23 Encounter for immunization: Secondary | ICD-10-CM | POA: Diagnosis not present

## 2021-07-25 DIAGNOSIS — C44310 Basal cell carcinoma of skin of unspecified parts of face: Secondary | ICD-10-CM | POA: Diagnosis not present

## 2021-07-25 DIAGNOSIS — L570 Actinic keratosis: Secondary | ICD-10-CM | POA: Diagnosis not present

## 2021-08-29 DIAGNOSIS — H2513 Age-related nuclear cataract, bilateral: Secondary | ICD-10-CM | POA: Diagnosis not present

## 2021-08-29 DIAGNOSIS — H04123 Dry eye syndrome of bilateral lacrimal glands: Secondary | ICD-10-CM | POA: Diagnosis not present

## 2021-08-29 DIAGNOSIS — H57813 Brow ptosis, bilateral: Secondary | ICD-10-CM | POA: Diagnosis not present

## 2021-09-17 DIAGNOSIS — L570 Actinic keratosis: Secondary | ICD-10-CM | POA: Diagnosis not present

## 2021-09-17 DIAGNOSIS — L821 Other seborrheic keratosis: Secondary | ICD-10-CM | POA: Diagnosis not present

## 2021-09-17 DIAGNOSIS — L820 Inflamed seborrheic keratosis: Secondary | ICD-10-CM | POA: Diagnosis not present

## 2021-09-17 DIAGNOSIS — L738 Other specified follicular disorders: Secondary | ICD-10-CM | POA: Diagnosis not present

## 2021-09-17 DIAGNOSIS — L538 Other specified erythematous conditions: Secondary | ICD-10-CM | POA: Diagnosis not present

## 2021-09-17 DIAGNOSIS — Z789 Other specified health status: Secondary | ICD-10-CM | POA: Diagnosis not present

## 2021-10-04 DIAGNOSIS — E785 Hyperlipidemia, unspecified: Secondary | ICD-10-CM | POA: Diagnosis not present

## 2021-10-04 DIAGNOSIS — Z125 Encounter for screening for malignant neoplasm of prostate: Secondary | ICD-10-CM | POA: Diagnosis not present

## 2021-10-04 DIAGNOSIS — I10 Essential (primary) hypertension: Secondary | ICD-10-CM | POA: Diagnosis not present

## 2021-10-09 DIAGNOSIS — I10 Essential (primary) hypertension: Secondary | ICD-10-CM | POA: Diagnosis not present

## 2021-10-09 DIAGNOSIS — I2510 Atherosclerotic heart disease of native coronary artery without angina pectoris: Secondary | ICD-10-CM | POA: Diagnosis not present

## 2021-10-09 DIAGNOSIS — E785 Hyperlipidemia, unspecified: Secondary | ICD-10-CM | POA: Diagnosis not present

## 2021-10-09 DIAGNOSIS — R918 Other nonspecific abnormal finding of lung field: Secondary | ICD-10-CM | POA: Diagnosis not present

## 2021-10-09 DIAGNOSIS — Z0000 Encounter for general adult medical examination without abnormal findings: Secondary | ICD-10-CM | POA: Diagnosis not present

## 2021-10-23 DIAGNOSIS — R3915 Urgency of urination: Secondary | ICD-10-CM | POA: Diagnosis not present

## 2021-10-23 DIAGNOSIS — C61 Malignant neoplasm of prostate: Secondary | ICD-10-CM | POA: Diagnosis not present

## 2021-10-23 DIAGNOSIS — N401 Enlarged prostate with lower urinary tract symptoms: Secondary | ICD-10-CM | POA: Diagnosis not present

## 2021-10-25 ENCOUNTER — Other Ambulatory Visit: Payer: Self-pay | Admitting: Internal Medicine

## 2021-10-25 DIAGNOSIS — R918 Other nonspecific abnormal finding of lung field: Secondary | ICD-10-CM

## 2021-11-07 ENCOUNTER — Other Ambulatory Visit: Payer: Self-pay | Admitting: Internal Medicine

## 2021-11-11 ENCOUNTER — Ambulatory Visit
Admission: RE | Admit: 2021-11-11 | Discharge: 2021-11-11 | Disposition: A | Discharge status: Home / Self Care | Payer: Medicare Other | Source: Ambulatory Visit | Attending: Internal Medicine | Admitting: Internal Medicine

## 2021-11-11 DIAGNOSIS — M545 Low back pain, unspecified: Secondary | ICD-10-CM | POA: Diagnosis not present

## 2021-11-11 DIAGNOSIS — I3139 Other pericardial effusion (noninflammatory): Secondary | ICD-10-CM | POA: Diagnosis not present

## 2021-11-11 DIAGNOSIS — R911 Solitary pulmonary nodule: Secondary | ICD-10-CM | POA: Diagnosis not present

## 2021-11-11 DIAGNOSIS — R918 Other nonspecific abnormal finding of lung field: Secondary | ICD-10-CM

## 2021-11-11 DIAGNOSIS — I700 Atherosclerosis of aorta: Secondary | ICD-10-CM | POA: Diagnosis not present

## 2021-11-11 DIAGNOSIS — C61 Malignant neoplasm of prostate: Secondary | ICD-10-CM | POA: Diagnosis not present

## 2021-12-25 ENCOUNTER — Encounter: Payer: Self-pay | Admitting: Cardiology

## 2021-12-25 ENCOUNTER — Ambulatory Visit: Payer: Medicare Other | Admitting: Cardiology

## 2021-12-25 VITALS — BP 120/74 | HR 65 | Temp 98.6°F | Resp 16 | Ht 72.0 in | Wt 225.2 lb

## 2021-12-25 DIAGNOSIS — E7800 Pure hypercholesterolemia, unspecified: Secondary | ICD-10-CM

## 2021-12-25 DIAGNOSIS — I452 Bifascicular block: Secondary | ICD-10-CM

## 2021-12-25 DIAGNOSIS — I25118 Atherosclerotic heart disease of native coronary artery with other forms of angina pectoris: Secondary | ICD-10-CM | POA: Diagnosis not present

## 2021-12-25 NOTE — Progress Notes (Signed)
? ?Primary Physician/Referring:  Willie Pretty, MD ? ?Patient ID: Willie Farrell, male    DOB: 07/17/1946, 76 y.o.   MRN: [id number] ? ?Chief Complaint  ?Patient presents with  ? Coronary Artery Disease  ? ?HPI:   ? 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  He has not been active and has not been exercising except doing routine chores around the house or on his yard.  However he has noticed "fatigue".  States this patient in the past 4 months he is easily fatigued with exertional activity but he has not had any frank chest pain.  Otherwise denies chest pain, dyspnea, palpitations, dizziness or syncope.  There is no family history of premature coronary artery disease.  Past medical history significant for hyperlipidemia. ? ?Past Medical History:  ?Diagnosis Date  ? Actinic keratoses   ? BPH (benign prostatic hyperplasia)   ? CAD (coronary artery disease)   ? Hypercholesteremia   ? Vitamin D deficiency   ? ?Past Surgical History:  ?Procedure Laterality Date  ? APPENDECTOMY    ? TONSILLECTOMY AND ADENOIDECTOMY    ? ?Family History  ?Problem Relation Age of Onset  ? Pneumonia Mother   ? Diabetes Father   ? Heart attack Father 44  ?  ?Social History  ? ?Tobacco Use  ? Smoking status: Former  ?  Years: 2.00  ?  Types: Cigarettes  ?  Quit date: 11/19/1968  ?  Years since quitting: 53.1  ? Smokeless tobacco: Never  ? Tobacco comments:  ?  only smoked in college  ?Substance Use Topics  ? Alcohol use: Yes  ?  Alcohol/week: 2.0 standard drinks  ?  Types: 2 Standard drinks or equivalent per week  ?  Comment: occ  ? ?Marital Status: Married  ?ROS  ?Review of Systems  ?Constitutional: Positive for malaise/fatigue.  ?Cardiovascular:  Negative for chest pain, dyspnea on exertion and leg swelling.  ?Gastrointestinal:  Negative for melena.  ?Objective  ?Blood pressure 120/74, pulse 65, temperature 98.6 ?F (37  ?C), temperature source Temporal, resp. rate 16, height 6' (1.829 m), weight 225 lb 3.2 oz (102.2 kg), SpO2 95 %.  ? ?  12/25/2021  ? 10:38 AM 12/24/2020  ?  3:05 PM 12/06/2020  ?  3:13 PM  ?Vitals with BMI  ?Height _0  _1  _2   ?Weight 225 lbs 3 oz 224 lbs 13 oz 230 lbs  ?BMI 30.54 30.48 31.19  ?Systolic 676 195 093  ?Diastolic 74 75 74  ?Pulse 65 80   ?  ? Physical Exam ?Cardiovascular:  ?   Rate and Rhythm: Normal rate and regular rhythm.  ?   Pulses: Intact distal pulses.  ?   Heart sounds: Normal heart sounds. No murmur heard. ?  No gallop.  ?   Comments: No leg edema, no JVD. ?Pulmonary:  ?   Effort: Pulmonary effort is normal.  ?   Breath sounds: Normal breath sounds.  ?Abdominal:  ?   General: Bowel sounds are normal.  ?   Palpations: Abdomen is soft.  ? ?Laboratory examination:  ? ? ?External labs:  ? ?Labs 11/11/2021: ? ?Total cholesterol 129, triglycerides 138, HDL 57, LDL 44.  Non-HDL cholesterol 72. ? ?Hb 15.2/HCT 45.7, platelets 200.  Normal indicis. ? ?BUN 18, creatinine 0.97, EGFR 76 mL, LFTs normal.  ? ?Labs 12/18/2016: TSH normal. ? ?Medications and allergies  ?  No Known Allergies  ? ? ?Current Outpatient Medications:  ?  Ascorbic Acid (VITAMIN C) 1000 MG tablet, Take 1,000 mg by mouth daily., Disp: , Rfl:  ?  aspirin 81 MG chewable tablet, Chew 1 tablet by mouth daily., Disp: , Rfl:  ?  rosuvastatin (CRESTOR) 20 MG tablet, Take 20 mg by mouth daily., Disp: , Rfl:   ? ?Radiology:  ? ?CT chest without contrast 11/11/2021: ?Normal cardiac size and configuration, significant three-vessel coronary artery calcification, stable from prior CT performed 10/22/2020.  Great vessels are normal in caliber with minor atherosclerotic changes. ?No significant extracardiac abnormality. ? ?Cardiac Studies:  ? ?Carotid artery duplex 02/21/2016: ?No hemodynamically significant arterial disease in the internal carotid artery bilaterally. Normal study without any significant plaque burden. ?Antegrade right vertebral artery  flow. Antegrade left vertebral artery flow. ? ? ?Coronary calcium score 10/22/2020: ? ?Total Agatston score 2369.  MeSA database percentile 92. ?Left Main: 0 ?LAD: 729 ?LCx: 673 ?RCA: 806 ?Ascending aorta measures 39 mm. ? ?Lexiscan Tetrofosmin stress test 12/03/2020: ?Lexiscan nuclear stress test performed using 1-day protocol. ?SPECT images show small sized, mild intensity perfusion defect on rest and stress images, with no significant reversibility. In absence of wall motion abnormality, the changes likely suggest tissue attenuation, but ischemia cannot be excluded. Clinical correlation recommended. Stress LVEF 55%. ? ?Echocardiogram 12/04/2020: ?Normal LV systolic function with visual EF 55-60%. Left ventricle cavity is normal in size. Normal global wall motion. Normal diastolic filling pattern, normal LAP.  ?Mild tricuspid regurgitation. No evidence of pulmonary hypertension. ?Compared to prior study dated 02/21/2016: no significant change.  ? ?EKG:  ? ?EKG 12/25/2021: Normal sinus rhythm at rate of 53 bpm, left axis deviation, left anterior fascicular block.  Right bundle branch block. Bifascicular block. No significant change from EKG 11/19/2020. ?   ? ?Assessment  ? ?  ICD-10-CM   ?1. Coronary artery disease involving native coronary artery of native heart with other form of angina pectoris ([place])  I25.118 EKG 12-Lead  ?  PCV CARDIAC STRESS TEST  ?  ?2. Hypercholesteremia  E78.00   ?  ?3. Bifascicular block  I45.2   ?  ?  ?There are no discontinued medications.  ?No orders of the defined types were placed in this encounter. ? ?Orders Placed This Encounter  ?Procedures  ? PCV CARDIAC STRESS TEST  ?  Standing Status:   Future  ?  Standing Expiration Date:   02/24/2022  ? EKG 12-Lead  ? ?Recommendations:  ? 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  He has not been active and has not been exercising  except doing routine chores around the house or on his yard.  However he has noticed "fatigue".  States this patient in the past 4 months he is easily fatigued with exertional activity but he has not had any frank chest pain.  Otherwise denies chest pain, dyspnea, palpitations, dizziness or syncope.  There is no family history of premature coronary artery disease.  Past medical history significant for hyperlipidemia. ? ?I suspect his fatigue could be anginal equivalent.  Previously he has had a nuclear stress test that was mildly abnormal, however it was a pharmacologic stress test.  I would like to schedule him for a simple routine treadmill exercise stress test to evaluate his exercise capacity and also to look for any ischemic changes.  If indeed EKG shows marked abnormality, he will  need cardiac catheterization.  I also reviewed his recently performed CT scan of the chest for follow-up of pulmonary nodules, fortunately no significant abnormality.  However he has severe three-vessel coronary calcification. ? ?Reviewed his external labs, lipids under excellent control.  We discussed regarding starting him on an ACE inhibitor or an ARB, patient prefers to wait unless stress test is abnormal he prefers not to be on any other therapy. ? ?If treadmill stress test is negative for ischemia, could consider statin holiday as well to see whether lipid therapy is causing him his symptoms.  Office visit in 6 months. ? ? ?Adrian Prows, MD, [facility] ?12/25/2021, 11:18 AM ?Office: [phone] ?

## 2022-01-30 ENCOUNTER — Telehealth: Payer: Self-pay | Admitting: Cardiology

## 2022-01-30 NOTE — Telephone Encounter (Signed)
Patient canceled [facility] appointment for 01/31/22. He cannot complete this until 03/21/22 but the order expires prior to this. Would someone be able to add in a new order so I can schedule his appointment for 03/21/22? Thank you.

## 2022-01-30 NOTE — Telephone Encounter (Signed)
I have extended this

## 2022-03-21 ENCOUNTER — Ambulatory Visit: Payer: Medicare Other

## 2022-03-21 DIAGNOSIS — I25118 Atherosclerotic heart disease of native coronary artery with other forms of angina pectoris: Secondary | ICD-10-CM

## 2022-05-21 DIAGNOSIS — C61 Malignant neoplasm of prostate: Secondary | ICD-10-CM | POA: Diagnosis not present

## 2022-05-21 DIAGNOSIS — R3915 Urgency of urination: Secondary | ICD-10-CM | POA: Diagnosis not present

## 2022-06-27 ENCOUNTER — Ambulatory Visit: Payer: Medicare Other | Admitting: Cardiology

## 2022-06-27 ENCOUNTER — Encounter: Payer: Self-pay | Admitting: Cardiology

## 2022-06-27 VITALS — BP 99/72 | HR 89 | Temp 97.8°F | Resp 16 | Ht 72.0 in | Wt 223.4 lb

## 2022-06-27 DIAGNOSIS — I452 Bifascicular block: Secondary | ICD-10-CM | POA: Diagnosis not present

## 2022-06-27 DIAGNOSIS — I2510 Atherosclerotic heart disease of native coronary artery without angina pectoris: Secondary | ICD-10-CM | POA: Diagnosis not present

## 2022-06-27 DIAGNOSIS — E7800 Pure hypercholesterolemia, unspecified: Secondary | ICD-10-CM

## 2022-06-27 NOTE — Progress Notes (Unsigned)
Primary Physician/Referring:  Deland Pretty, MD  Patient ID: Willie Farrell, male    DOB: 1946/05/20, 76 y.o.   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  He has not been active and has not been exercising except doing routine chores around the house or on his yard.  However he has noticed "fatigue".  States this patient in the past 4 months he is easily fatigued with exertional activity but he has not had any frank chest pain.  Otherwise denies chest pain, dyspnea, palpitations, dizziness or syncope.  There is no family history of premature coronary artery disease.  Past medical history significant for hyperlipidemia.  Past Medical History:  Diagnosis Date   Actinic keratoses    BPH (benign prostatic hyperplasia)    CAD (coronary artery disease)    Hypercholesteremia    Vitamin D deficiency    Past Surgical History:  Procedure Laterality Date   APPENDECTOMY     TONSILLECTOMY AND ADENOIDECTOMY     Family History  Problem Relation Age of Onset   Pneumonia Mother    Diabetes Father    Heart attack Father 13    Social History   Tobacco Use   Smoking status: Former    Packs/day: 0.50    Years: 2.00    Total pack years: 1.00    Types: Cigarettes    Quit date: 11/19/1968    Years since quitting: 53.6   Smokeless tobacco: Never   Tobacco comments:    only smoked in college  Substance Use Topics   Alcohol use: Yes    Alcohol/week: 2.0 standard drinks of alcohol    Types: 2 Standard drinks or equivalent per week    Comment: occ   Marital Status: Married  ROS  Review of Systems  Constitutional: Positive for malaise/fatigue.  Cardiovascular:  Positive for dyspnea on exertion. Negative for chest pain and leg  swelling.  Gastrointestinal:  Negative for melena.   Objective  Blood pressure 99/72, pulse 89, temperature 97.8 F (36.6 C), temperature source Temporal, resp. rate 16, height 6' (1.829 m), weight 223 lb 6.4 oz (101.3 kg), SpO2 92 %.     06/27/2022    1:20 PM 12/25/2021   10:38 AM 12/24/2020    3:05 PM  Vitals with BMI  Height _0  _1  _2   Weight 223 lbs 6 oz 225 lbs 3 oz 224 lbs 13 oz  BMI 30.29 99.83 38.25  Systolic 99 053 976  Diastolic 72 74 75  Pulse 89 65 80     Physical Exam Neck:     Vascular: No carotid bruit or JVD.  Cardiovascular:     Rate and Rhythm: Normal rate and regular rhythm.     Pulses: Intact distal pulses.     Heart sounds: Normal heart sounds. No murmur heard.    No gallop.  Pulmonary:     Effort: Pulmonary effort is normal.     Breath sounds: Normal breath sounds.  Abdominal:     General: Bowel sounds are normal.     Palpations: Abdomen is soft.  Musculoskeletal:     Right lower leg: No edema.     Left lower leg: No edema.  Laboratory examination:    External labs:   Labs 11/11/2021:  Total cholesterol 129, triglycerides 138, HDL 57, LDL 44.  Non-HDL cholesterol 72.  Hb 15.2/HCT 45.7, platelets 200.  Normal indicis.  BUN 18, creatinine 0.97, EGFR 76 mL, LFTs normal.   Labs 12/18/2016: TSH normal.  Medications and allergies  No Known Allergies    Current Outpatient Medications:    Ascorbic Acid (VITAMIN C) 1000 MG tablet, Take 1,000 mg by mouth daily., Disp: , Rfl:    aspirin 81 MG chewable tablet, Chew 1 tablet by mouth daily., Disp: , Rfl:    rosuvastatin (CRESTOR) 20 MG tablet, Take 20 mg by mouth daily., Disp: , Rfl:    Radiology:   CT chest without contrast 11/11/2021: Normal cardiac size and configuration, significant three-vessel coronary artery calcification, stable from prior CT performed 10/22/2020.  Great vessels are normal in caliber with minor atherosclerotic changes. No significant extracardiac  abnormality.  Cardiac Studies:   Carotid artery duplex 13-Mar-2016: No hemodynamically significant arterial disease in the internal carotid artery bilaterally. Normal study without any significant plaque burden. Antegrade right vertebral artery flow. Antegrade left vertebral artery flow.  Coronary calcium score 10/22/2020:  Total Agatston score 2369.  MeSA database percentile 92. Left Main: 0 LAD: 729 LCx: 835 RCA: 806 Ascending aorta measures 39 mm.  Lexiscan Tetrofosmin stress test 12/03/2020: Lexiscan nuclear stress test performed using 1-day protocol. SPECT images show small sized, mild intensity perfusion defect on rest and stress images, with no significant reversibility. In absence of wall motion abnormality, the changes likely suggest tissue attenuation, but ischemia cannot be excluded. Clinical correlation recommended. Stress LVEF 55%.  Echocardiogram 12/04/2020: Normal LV systolic function with visual EF 55-60%. Left ventricle cavity is normal in size. Normal global wall motion. Normal diastolic filling pattern, normal LAP.  Mild tricuspid regurgitation. No evidence of pulmonary hypertension. Compared to prior study dated 03-13-2016: no significant change.   EKG:   EKG 06/27/2022: Normal sinus rhythm at rate of 72 bpm, left atrial enlargement, left axis deviation, left anterior fascicular block.  Right bundle branch block.  Bifascicular block.  Compared to 12/25/2021, no significant change.     Assessment     ICD-10-CM   1. Coronary artery disease involving native coronary artery of native heart without angina pectoris  I25.10 EKG 12-Lead    2. Hypercholesteremia  E78.00     3. Bifascicular block  I45.2       There are no discontinued medications.  No orders of the defined types were placed in this encounter.  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  Over the past 6 months he is easily fatigued with exertional activity, decreased exercise tolerance and ? Dyspnea on exertion. He has not had any frank chest pain.   1. Coronary artery disease involving native coronary artery of native heart without angina pectoris Patient coronary calcium score is extremely high, his symptoms of fatigue and decreased exercise tolerance and dyspnea may be anginal equivalent.  However I would like to see if his symptoms that have occurred and have gradually progressed over the past 6 months is related to statins, statin holiday for a month and if symptoms improve, with changes statin, however if symptoms do not improve, we may have to consider cardiac catheterization or coronary CT  angiogram.  Unable to add any beta-blockers or vasodilators in view of very soft blood pressure, patient does not have any history of hypertension.  Hence not on ACE inhibitors or ARB or beta-blocker.  2. Hypercholesteremia Lipids under excellent control.  Continue the same.  3. Bifascicular block Patient's bifascicular block remained stable.  Patient is also complaining about bilateral lower extremity giving up easily, suspect he may have some form of peripheral neuropathy as well and this could be related to either a statin related muscle weakness and leg fatigue versus neuropathy, if symptoms persist, neurologic opinion with EMG would be appropriate after statin holiday.  I would like to see him back in 3 months for follow-up.    Willie Prows, MD, [facility] 06/27/2022, 2:06 PM Office: [phone]

## 2022-06-29 IMAGING — CT CT L SPINE W/O CM
1 of 6 series · 7 of 14 positions shown, 9 images · non-contrast
Comparison: Abdomen and pelvis CT 06/29/2018

CLINICAL DATA: Low back pain for 5 months. Prostate cancer for 1
year



[Series 3: l spine soft · axial · 0.44mm/px · z∈[-508,-310]mm · 7 of 133 slices shown, 9 images]
[im 17/133  soft-tissue]
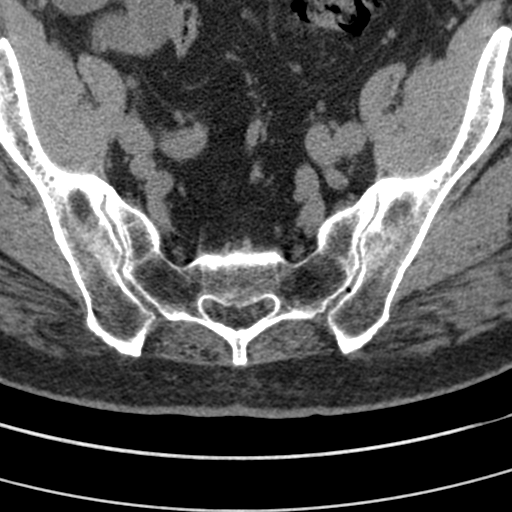
[im 17/133  bone]
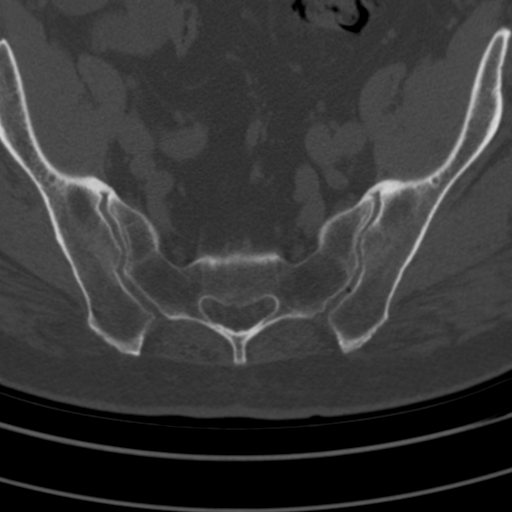
[im 34/133  bone]
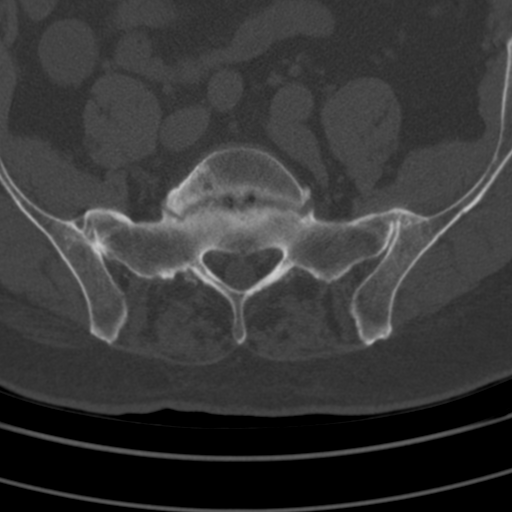
[im 50/133  bone]
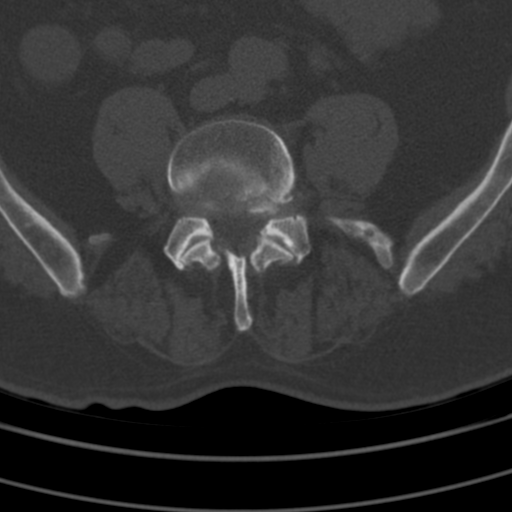
[im 67/133  bone]
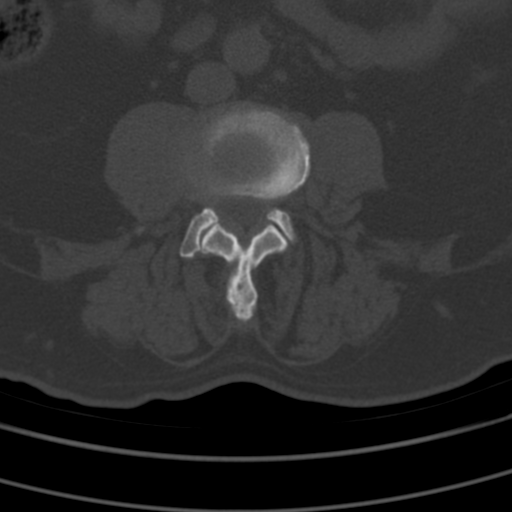
[im 83/133  soft-tissue]
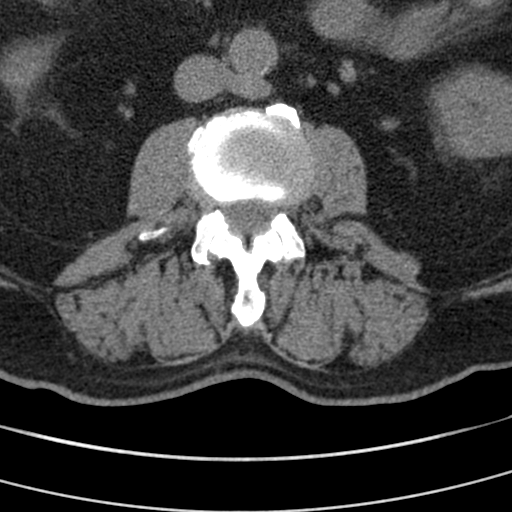
[im 83/133  bone]
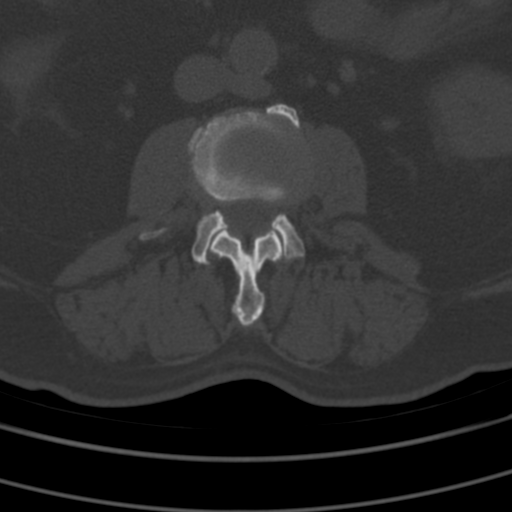
[im 100/133  bone]
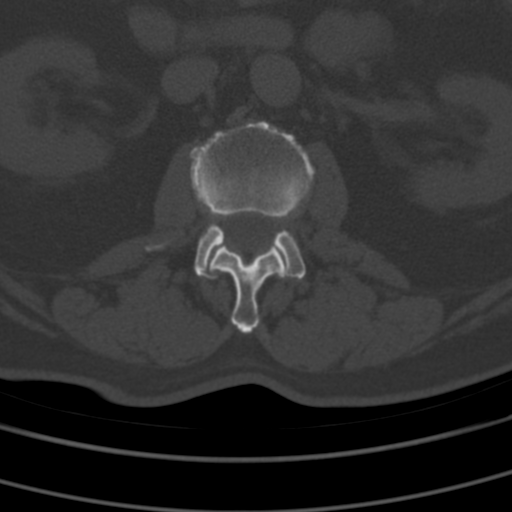
[im 116/133  bone]
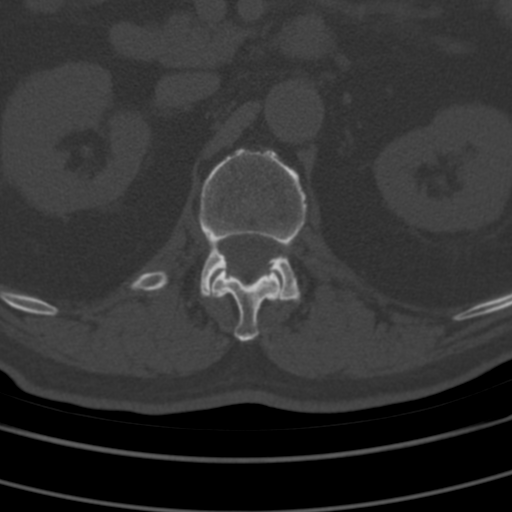

[7 of 14 positions shown; findings below may reference images not displayed]

FINDINGS: Segmentation: 5 lumbar type vertebrae with hypoplastic T12 rib on
the left.

Alignment: Mild degenerative anterolisthesis at L5-S1.

Vertebrae: No acute fracture or focal pathologic process.

Paraspinal and other soft tissues: Negative.

Disc levels:

T12- L1: Unremarkable.

L1-L2: Unremarkable.

L2-L3: Mild rightward disc bulging and facet spurring.

L3-L4: Mild disc bulging and facet spurring. Ligamentum flavum
thickening. Mild spinal stenosis

L4-L5: Disc narrowing and bulging with endplate and facet spurring
eccentric to the left where there is moderate foraminal impingement.
Mild to moderate right foraminal narrowing

L5-S1:Disc collapse with gas containing fissure. Degenerative facet
spurring on both sides. Bulky facet osteoarthritis. Bilateral
foraminal impingement, greater on the right.
IMPRESSION: 1. No acute finding or bone lesion.
2. Lumbar spine degeneration with foraminal impingement on the left
at L4-5 and on the right more than left at L5-S1.
3. Mild spinal stenosis at L3-4.

## 2022-07-23 DIAGNOSIS — Z23 Encounter for immunization: Secondary | ICD-10-CM | POA: Diagnosis not present

## 2022-08-02 ENCOUNTER — Encounter: Payer: Self-pay | Admitting: Cardiology

## 2022-09-30 ENCOUNTER — Ambulatory Visit: Payer: Medicare Other | Admitting: Cardiology

## 2022-10-01 ENCOUNTER — Encounter: Payer: Self-pay | Admitting: Cardiology

## 2022-10-01 ENCOUNTER — Other Ambulatory Visit: Payer: Self-pay | Admitting: Urology

## 2022-10-01 ENCOUNTER — Ambulatory Visit: Payer: Medicare Other | Admitting: Cardiology

## 2022-10-01 VITALS — BP 119/77 | HR 66 | Resp 16 | Ht 72.0 in | Wt 225.0 lb

## 2022-10-01 DIAGNOSIS — E7800 Pure hypercholesterolemia, unspecified: Secondary | ICD-10-CM | POA: Diagnosis not present

## 2022-10-01 DIAGNOSIS — C61 Malignant neoplasm of prostate: Secondary | ICD-10-CM

## 2022-10-01 DIAGNOSIS — I452 Bifascicular block: Secondary | ICD-10-CM | POA: Diagnosis not present

## 2022-10-01 DIAGNOSIS — I2510 Atherosclerotic heart disease of native coronary artery without angina pectoris: Secondary | ICD-10-CM | POA: Diagnosis not present

## 2022-10-01 DIAGNOSIS — R350 Frequency of micturition: Secondary | ICD-10-CM | POA: Diagnosis not present

## 2022-10-01 DIAGNOSIS — N401 Enlarged prostate with lower urinary tract symptoms: Secondary | ICD-10-CM

## 2022-10-01 NOTE — Progress Notes (Signed)
Primary Physician/Referring:  Deland Pretty, MD  Patient ID: Willie Farrell, male    DOB: 11/20/1945, 77 y.o.   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  Over the past 8-9 months he is easily fatigued with exertional activity, decreased exercise tolerance and ? Dyspnea on exertion. He has not had any frank chest pain.   Past Medical History:  Diagnosis Date   Actinic keratoses    BPH (benign prostatic hyperplasia)    CAD (coronary artery disease)    Hypercholesteremia    Vitamin D deficiency    Past Surgical History:  Procedure Laterality Date   APPENDECTOMY     TONSILLECTOMY AND ADENOIDECTOMY     Family History  Problem Relation Age of Onset   Pneumonia Mother    Diabetes Father    Heart attack Father 770   Social History   Tobacco Use   Smoking status: Former    Packs/day: 0.50    Years: 2.00    Total pack years: 1.00    Types: Cigarettes    Quit date: 11/19/1968    Years since quitting: 53.9   Smokeless tobacco: Never   Tobacco comments:    only smoked in college  Substance Use Topics   Alcohol use: Yes    Alcohol/week: 2.0 standard drinks of alcohol    Types: 2 Standard drinks or equivalent per week    Comment: occ   Marital Status: Married  ROS  Review of Systems  Constitutional: Positive for malaise/fatigue.  Cardiovascular:  Positive for dyspnea on exertion. Negative for chest pain and leg swelling.  Genitourinary:  Positive for frequency.   Objective  Blood pressure 119/77, pulse 66, resp. rate 16, height 6' (1.829 m), weight 225 lb (102.1 kg), SpO2 96 %.     10/01/2022    3:39 PM 06/27/2022    1:20 PM 12/25/2021   10:38 AM  Vitals with BMI  Height '6\' 0"'$   '6\' 0"'$  '6\' 0"'$   Weight 225 lbs 223 lbs 6 oz 225 lbs 3 oz  BMI 30.51 352.77382.42 Systolic 135399 1614 Diastolic 77 72 74  Pulse 66 89 65     Physical Exam Neck:     Vascular: No carotid bruit or JVD.  Cardiovascular:     Rate and Rhythm: Normal rate and regular rhythm.     Pulses: Intact distal pulses.     Heart sounds: Normal heart sounds. No murmur heard.    No gallop.  Pulmonary:     Effort: Pulmonary effort is normal.     Breath sounds: Normal breath sounds.  Abdominal:     General: Bowel sounds are normal.     Palpations: Abdomen is soft.  Musculoskeletal:     Right lower leg: No edema.     Left lower leg: No edema.    Laboratory examination:   External labs:   Labs 11/11/2021:  Total cholesterol 129, triglycerides 138, HDL 57, LDL 44.  Non-HDL cholesterol 72.  Hb 15.2/HCT 45.7, platelets 200.  Normal indicis.  BUN 18, creatinine 0.97, EGFR 76 mL, LFTs normal.   Labs 12/18/2016: TSH normal.  Medications and allergies  No Known Allergies  Current Outpatient Medications:    Ascorbic Acid (VITAMIN C) 1000 MG tablet, Take 1,000 mg by mouth daily., Disp: , Rfl:    aspirin 81 MG chewable tablet, Chew 1 tablet by mouth daily., Disp: , Rfl:    rosuvastatin (CRESTOR) 20 MG tablet, Take 20 mg by mouth daily., Disp: , Rfl:    Radiology:   CT chest without contrast 11/11/2021: Normal cardiac size and configuration, significant three-vessel coronary artery calcification, stable from prior CT performed 10/22/2020.  Great vessels are normal in caliber with minor atherosclerotic changes. No significant extracardiac abnormality.  Cardiac Studies:   Carotid artery duplex 2016-02-29: No hemodynamically significant arterial disease in the internal carotid artery bilaterally. Normal study without any significant plaque burden. Antegrade right vertebral artery flow. Antegrade left vertebral artery flow.  Coronary calcium score 10/22/2020:  Total Agatston score 2369.  MeSA  database percentile 92. Left Main: 0 LAD: 729 LCx: 835 RCA: 806 Ascending aorta measures 39 mm.  Lexiscan Tetrofosmin stress test 12/03/2020: Lexiscan nuclear stress test performed using 1-day protocol. SPECT images show small sized, mild intensity perfusion defect on rest and stress images, with no significant reversibility. In absence of wall motion abnormality, the changes likely suggest tissue attenuation, but ischemia cannot be excluded. Clinical correlation recommended. Stress LVEF 55%.  Echocardiogram 12/04/2020: Normal LV systolic function with visual EF 55-60%. Left ventricle cavity is normal in size. Normal global wall motion. Normal diastolic filling pattern, normal LAP.  Mild tricuspid regurgitation. No evidence of pulmonary hypertension. Compared to prior study dated 02/29/2016: no significant change.   EKG:   EKG 06/27/2022: Normal sinus rhythm at rate of 72 bpm, left atrial enlargement, left axis deviation, left anterior fascicular block.  Right bundle branch block.  Bifascicular block.  Compared to 12/25/2021, no significant change.     Assessment     ICD-10-CM   1. Coronary artery disease involving native coronary artery of native heart without angina pectoris  I25.10     2. Hypercholesteremia  E78.00     3. Bifascicular block  I45.2     4. Benign prostatic hyperplasia with urinary frequency  N40.1    R35.0       There are no discontinued medications.  No orders of the defined types were placed in this encounter.  No orders of the defined types were placed in this encounter.  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  Over the past 8-9 months he is easily fatigued with exertional activity, decreased exercise tolerance and ? Dyspnea on exertion. He has not had any frank chest pain.   1. Coronary artery  disease involving native coronary artery of native heart without angina pectoris Patient coronary calcium score is extremely high, his symptoms of fatigue and decreased exercise tolerance and dyspnea may be anginal equivalent.  Patient had discontinued statins for a month as per my recommendation 3 months ago, however no change in symptoms since he is back on the statins. Unable to add any beta-blockers or vasodilators in view of very soft blood pressure, patient does not have any history of hypertension.  Hence not on ACE inhibitors or ARB or beta-blocker.  His symptoms of easy fatigue and decreased exercise tolerance could also be due to markedly altered sleep pattern, he is having to go to the bathroom very often at night and wakes up at least 5-6 times.  He  is also having small naps during the daytime.  I suspect altered sleep may be contributing to his symptomatology.  A component of central sleep apnea cannot be excluded.  2. Hypercholesteremia Lipids under excellent control.  Continue the same.  3. Bifascicular block Patient's bifascicular block remained stable.  No symptoms of syncope or near syncope.  4. Benign prostatic hyperplasia with urinary frequency Patient is seeing Dr. Deland Pretty soon, for management of primary care issues along with frequency of urination as well.  It would be interesting to see whether addition of tadalafil 20 mg 1/2 tablet every other day along with addition of a simple BPH medication like doxazosin combination may help with the symptoms.  If he continues to have persistent fatigue and decreased exercise tolerance in spite of workup of his sleep pattern, would probably consider cardiac catheterization to exclude significant multivessel disease as it could be anginal equivalent.  Would like to see him back in 6 months for follow-up.    Adrian Prows, MD, [facility] 10/01/2022, 4:49 PM Office: [phone]

## 2022-10-13 DIAGNOSIS — N529 Male erectile dysfunction, unspecified: Secondary | ICD-10-CM | POA: Diagnosis not present

## 2022-10-13 DIAGNOSIS — N401 Enlarged prostate with lower urinary tract symptoms: Secondary | ICD-10-CM | POA: Diagnosis not present

## 2022-10-13 DIAGNOSIS — R5383 Other fatigue: Secondary | ICD-10-CM | POA: Diagnosis not present

## 2022-10-13 DIAGNOSIS — I10 Essential (primary) hypertension: Secondary | ICD-10-CM | POA: Diagnosis not present

## 2022-10-13 DIAGNOSIS — Z0000 Encounter for general adult medical examination without abnormal findings: Secondary | ICD-10-CM | POA: Diagnosis not present

## 2022-10-13 DIAGNOSIS — L578 Other skin changes due to chronic exposure to nonionizing radiation: Secondary | ICD-10-CM | POA: Diagnosis not present

## 2022-10-13 DIAGNOSIS — K219 Gastro-esophageal reflux disease without esophagitis: Secondary | ICD-10-CM | POA: Diagnosis not present

## 2022-10-13 DIAGNOSIS — R269 Unspecified abnormalities of gait and mobility: Secondary | ICD-10-CM | POA: Diagnosis not present

## 2022-10-13 DIAGNOSIS — I2510 Atherosclerotic heart disease of native coronary artery without angina pectoris: Secondary | ICD-10-CM | POA: Diagnosis not present

## 2022-10-13 DIAGNOSIS — E559 Vitamin D deficiency, unspecified: Secondary | ICD-10-CM | POA: Diagnosis not present

## 2022-10-17 ENCOUNTER — Encounter: Payer: Self-pay | Admitting: Cardiology

## 2022-11-10 ENCOUNTER — Ambulatory Visit
Admission: RE | Admit: 2022-11-10 | Discharge: 2022-11-10 | Disposition: A | Discharge status: Home / Self Care | Payer: Medicare Other | Source: Ambulatory Visit | Attending: Urology | Admitting: Urology

## 2022-11-10 DIAGNOSIS — C61 Malignant neoplasm of prostate: Secondary | ICD-10-CM | POA: Diagnosis not present

## 2022-11-10 DIAGNOSIS — R972 Elevated prostate specific antigen [PSA]: Secondary | ICD-10-CM | POA: Diagnosis not present

## 2022-11-10 MED ORDER — GADOPICLENOL 0.5 MMOL/ML IV SOLN
10.0000 mL | Freq: Once | INTRAVENOUS | Status: AC | PRN
  Administered 2022-11-10: 10 mL via INTRAVENOUS

## 2022-11-25 DIAGNOSIS — H35371 Puckering of macula, right eye: Secondary | ICD-10-CM | POA: Diagnosis not present

## 2022-12-22 DIAGNOSIS — D075 Carcinoma in situ of prostate: Secondary | ICD-10-CM | POA: Diagnosis not present

## 2022-12-22 DIAGNOSIS — N4289 Other specified disorders of prostate: Secondary | ICD-10-CM | POA: Diagnosis not present

## 2022-12-22 DIAGNOSIS — C61 Malignant neoplasm of prostate: Secondary | ICD-10-CM | POA: Diagnosis not present

## 2022-12-24 DIAGNOSIS — X32XXXA Exposure to sunlight, initial encounter: Secondary | ICD-10-CM | POA: Diagnosis not present

## 2022-12-24 DIAGNOSIS — B351 Tinea unguium: Secondary | ICD-10-CM | POA: Diagnosis not present

## 2022-12-24 DIAGNOSIS — L570 Actinic keratosis: Secondary | ICD-10-CM | POA: Diagnosis not present

## 2022-12-24 DIAGNOSIS — X32XXXD Exposure to sunlight, subsequent encounter: Secondary | ICD-10-CM | POA: Diagnosis not present

## 2022-12-24 DIAGNOSIS — C44311 Basal cell carcinoma of skin of nose: Secondary | ICD-10-CM | POA: Diagnosis not present

## 2023-01-16 ENCOUNTER — Telehealth: Payer: Self-pay

## 2023-01-16 NOTE — Telephone Encounter (Signed)
Tried to call patient to give appointment information but voicemail isnt set up will send packet with appointment details and a call back number if patient has any questions

## 2023-01-29 ENCOUNTER — Encounter: Payer: Self-pay | Admitting: *Deleted

## 2023-02-02 ENCOUNTER — Ambulatory Visit (INDEPENDENT_AMBULATORY_CARE_PROVIDER_SITE_OTHER): Payer: Medicare Other | Admitting: Neurology

## 2023-02-02 ENCOUNTER — Encounter: Payer: Self-pay | Admitting: Neurology

## 2023-02-02 VITALS — BP 97/62 | HR 56 | Ht 72.0 in | Wt 221.8 lb

## 2023-02-02 DIAGNOSIS — C61 Malignant neoplasm of prostate: Secondary | ICD-10-CM | POA: Diagnosis not present

## 2023-02-02 DIAGNOSIS — Z818 Family history of other mental and behavioral disorders: Secondary | ICD-10-CM | POA: Diagnosis not present

## 2023-02-02 DIAGNOSIS — R413 Other amnesia: Secondary | ICD-10-CM | POA: Diagnosis not present

## 2023-02-02 DIAGNOSIS — R269 Unspecified abnormalities of gait and mobility: Secondary | ICD-10-CM

## 2023-02-02 NOTE — Patient Instructions (Signed)
We will monitor your gait disorder and memory loss.  I would like to go ahead a make a referral to neuropsychology for formal, in depth memory evaluation.  We will do a brain scan, called MRI and call you with the test results. We will have to schedule you for this on a separate date. This test requires authorization from your insurance, and we will take care of the insurance process. We will check blood work today and call you with the test results.

## 2023-02-02 NOTE — Progress Notes (Signed)
Subjective:    Patient ID: Willie Farrell is a 77 y.o. male.  HPI    Willie Foley, MD, PhD [facility] [address] [address] [place], [place] [address]  Dear Willie Farrell,  I saw your patient, Willie Farrell, upon your kind request in my neurologic clinic today for evaluation of his gait disorder.  The patient is unaccompanied today.  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  He reports that he has a longstanding history of walking with a shuffle.  He feels that he has walked with a shuffle for over 40 years back in the past few years this has become worse.  He has noticed some changes in his balance, is more mindful of balance issues, sometimes feels off balance when he goes downstairs and has to hold on.  He has not actually fallen thankfully.  He does not use a walking aid.  He denies any pain in the back or major joints.  He does not have much in the way of difficulty with fine motor skills or tremors.  He has noticed in the past few years difficulty with his short-term memory.  He has been more forgetful.  He retired about 5 years ago and even then felt that his memory was not as sharp.  His dad had dementia, possibly Alzheimer's.  His paternal grandparents passed away when he was still a young child so he does not remember that much.  No issues with memory loss on mom side, mom herself lived to be 32 years old.  He was diagnosed with prostate cancer last year and had a biopsy done.  He had another biopsy this year and his PSA has crept up.  He is now planning to pursue treatment such as radiation, they have a case conference this week with urology, oncology and radiation oncology  as I understand.    I reviewed your office note from 10/13/2022.  He had blood work through your office on 10/09/2022 and I reviewed the results: Total cholesterol was 126, HDL 61, LDL 40, PSA was elevated at 7.41.  CBC with differential and platelets showed borderline high hematocrit at 47.1, otherwise normal findings.  CMP showed BUN of 14, creatinine 0.95, sodium 141, potassium 4.4, alk phos 68, ALT 27, AST 20.  I had evaluated him several years ago for dizziness. He had a brain MRI without contrast on 03/05/2016 and I have reviewed the results: IMPRESSION: 1. No acute intracranial abnormality. 2. Findings of mild chronic microvascular disease. Otherwise unremarkable MRI of the brain for age.  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 He was seen at [facility] in the emergency room and had extensive workup in [place], [place]. At the time of the episode he was with his grandson. He had a head CT without contrast in the emergency room which was negative. He did have an episode of vomiting. This was before he went to the emergency room. Emergency room records were reviewed from 02/09/2016. He was noted to have sinus bradycardia, EKG showed incomplete right bundle branch block. Blood work showed normal CBC with differential, normal BMP, troponin negative,  chest x-ray single view portable showed no acute cardiopulmonary disease. CT head without contrast on 02/09/2016 showed no evidence of an acute intracranial abnormality. He was felt to have near-syncope, possibly a hypoglycemic episode. Of note, blood sugar level was 124 at the time of checking in the emergency room.    He was discharged from the emergency room. He had another brief episode of dizziness upon standing up in church about 3 weeks later.  This happened when he was kneeling and stood up, symptoms lasted for less than a minute. He denies frank spinning sensation or swaying but does sometimes feels a little off when walking, feels like he may be veering to one side. Thankfully he has not fallen. He denies any headache, one-sided weakness, numbness, slurring of speech, droopy face, denies any sinister snoring, has never been told that he pauses in his breathing while asleep but wife has mentioned moaning while he is asleep. He has never had a sleep study.  Of note, he does not typically hydrate well with water. He likes to drink 2 large cups of coffee in the morning, 1 soda per day, usually 5 days a week, and sweet tea several glasses per day. He admits that he drinks sweet tea. His A1c he reports is less than 7. I reviewed your office note from 03/04/2016, which you kindly included. Vitals were stable. Exam was nonfocal. Of note, he did not see Willie Farrell, but had an echocardiogram in his office on 02/21/2016 which showed left ventricle cavity of normal size, normal global wall motion. Calculated EF 56%, trace mitral regurgitation, mild tricuspid regurgitation, no evidence of pulmonary hypertension. Bilateral carotid ultrasound at Willie Farrell office from 02/21/2016 showed no hemodynamically significant arterial disease in the internal carotid arteries bilaterally. Normal study without any significant plaque burden. Antegrade right vertebral artery flow. Antegrade left vertebral artery flow. Current medications include Flomax, Crestor, he stopped myrbetriq. He was advised to start a baby aspirin but has not started it yet.  His Past Medical History Is Significant For: Past Medical History:  Diagnosis Date   Actinic keratoses    Actinic keratoses    Anxiety    BPH (benign prostatic hyperplasia)    CAD (coronary artery disease)    Hypercholesteremia    Nocturia    Prostate cancer (HCC)    2023   Vitamin D deficiency     His Past Surgical  History Is Significant For: Past Surgical History:  Procedure Laterality Date   APPENDECTOMY     BASAL CELL CARCINOMA EXCISION     03/2018 side of nose   PROSTATE BIOPSY     Grade 6,   TONSILLECTOMY AND ADENOIDECTOMY      His Family History Is Significant For: Family History  Problem Relation Age of Onset   Pneumonia Mother    Diabetes Father    Heart attack Father 53    His Social History Is Significant For: Social History   Socioeconomic History   Marital status: Married    Spouse name: Not on file   Number of children: 3   Years of education: MD   Highest education level: Not on file  Occupational History   Occupation: [employer]  Tobacco Use   Smoking status: Former    Packs/day: 0.50    Years: 2.00    Additional pack years: 0.00    Total pack years: 1.00    Types: Cigarettes    Quit date: 11/19/1968    Years since quitting: 50.2  Smokeless tobacco: Never   Tobacco comments:    only smoked in college  Vaping Use   Vaping Use: Never used  Substance and Sexual Activity   Alcohol use: Yes    Alcohol/week: 2.0 standard drinks of alcohol    Types: 2 Standard drinks or equivalent per week    Comment: occ   Drug use: No   Sexual activity: Not on file  Other Topics Concern   Not on file  Social History Narrative   Drinks 2 cups of coffee a day    Lives wife.     Retired MD Endo/ PCP Diabetes specilsit   Social Determinants of Corporate investment banker Strain: Not on file  Food Insecurity: Not on file  Transportation Needs: Not on file  Physical Activity: Not on file  Stress: Not on file  Social Connections: Not on file    His Allergies Are:  No Known Allergies:   His Current Medications Are:  Outpatient Encounter Medications as of 02/02/2023  Medication Sig   ascorbic acid (VITAMIN C) 500 MG tablet Take 1 tablet by mouth daily.   aspirin 81 MG chewable tablet Chew 1 tablet by mouth daily.   cholecalciferol (VITAMIN D3) 25 MCG (1000  UNIT) tablet Take 1,000 Units by mouth daily.   modafinil (PROVIGIL) 200 MG tablet Take 100-200 mg by mouth daily.   rosuvastatin (CRESTOR) 20 MG tablet Take by mouth daily.   No facility-administered encounter medications on file as of 02/02/2023.  :   Review of Systems:  Out of a complete 14 point review of systems, all are reviewed and negative with the exception of these symptoms as listed below:  Review of Systems  Neurological:        C/o shuffling gait worsening.  Slow, more deliberate.  No falls.  (Did mention memory loss). Was taken off crestor for 1 month.  Diagnoses prostate CA.    Objective:  Neurological Exam  Physical Exam Physical Examination:   Vitals:   02/02/23 0810  BP: 97/62  Pulse: (!) 56    General Examination: The patient is a very pleasant 77 y.o. male in no acute distress. He appears well-developed and well-nourished and well groomed.  No lightheadedness upon standing.  We did offer him some water due to low blood pressure.  HEENT: Normocephalic, atraumatic, pupils are equal, round and reactive, tracking well-preserved, possibly mild facial masking but not very prominent.  No significant nuchal rigidity, perhaps slight.  Good range of motion in the neck.  Speech is clear without hypophonia, dysarthria or voice tremor.   Chest: Clear to auscultation without wheezing, rhonchi or crackles noted.  Heart: S1+S2+0, regular and normal without murmurs, rubs or gallops noted.  Mild bradycardia noted.  Abdomen: Soft, non-tender and non-distended.  Extremities: There is no pitting edema in the distal lower extremities bilaterally.   Skin: Warm and dry without trophic changes noted.   Musculoskeletal: exam reveals no obvious joint deformities.   Neurologically:  Mental status: The patient is awake, alert and oriented in all 4 spheres. His immediate and remote memory, attention, language skills and fund of knowledge are fairly well-preserved, he does have some  word finding difficulty and some delay in responding at times.  There is no evidence of aphasia, agnosia, apraxia or anomia. Speech is clear with normal prosody and enunciation. Thought process is linear. Mood is normal and affect is normal.  Cranial nerves II - XII are as described above under HEENT exam.  Motor exam:  Normal bulk, strength and tone is noted. There is no obvious action or resting tremor.  No significant postural tremor. Fine motor skills and coordination: Slight decrease in finger taps bilaterally, otherwise good hand movements and rapid alternating patting bilaterally.  Mildly impaired foot taps bilaterally, no lateralization.   Cerebellar testing: No dysmetria or intention tremor. There is no truncal or gait ataxia.  Sensory exam: intact to light touch in the upper and lower extremities.  Gait, station and balance: He stands without difficulty but with mild slowness.  Posture is age-appropriate to slightly stooped for age.  He walks with slightly decreased stride length and pace, no telltale shuffling, decreased arm swing bilaterally.  Assessment and plan:  In summary, Willie Farrell is a very pleasant 77 y.o.-year old male with an underlying medical history of prostate cancer, vitamin D deficiency, hypertension, hyperlipidemia, actinic keratosis, anxiety and borderline obesity, who presents for evaluation of his gait disturbance of several years duration, worsening in the past year or so.  He has thankfully not fallen.  History and examination not telltale for idiopathic Parkinson's disease but he does have a slightly stooped posture and subtle features with decreased finger taps and possibly mild facial masking, mild decrease in arm swing bilaterally.  He has no telltale lateralization.  He is advised to proceed with a brain MRI.  We can compare with findings from nearly 7 years ago.  He has noticed short-term memory issues.  I would like for him to get evaluated with a more  in-depth memory evaluation with a neuropsychologist.  I made a referral.  We will monitor his memory loss.  I would like to do some blood work today including TSH and B12.  We will repeat the chemistry panel in preparation for his brain MRI with and without contrast.  He is advised to stay well-hydrated and monitor his symptoms.  We will reconvene in a few months.  I may consider a DaTscan in the near future, he may have a touch of parkinsonism, may be at risk of atypical parkinsonism, given his prior history of dizziness, low blood pressure and heart rate values, and memory issues.  He may be at risk for MSA.  Again, I do not believe we have enough to label him as atypical parkinsonism and should certainly monitor his symptoms and examination. I answered all his questions today and he was in agreement with our plan to proceed with labs and brain MRI.  We will plan a follow-up in approximately 4 months, sooner if needed.  Thank you very much for allowing me to participate in the care of this nice patient. If I can be of any further assistance to you please do not hesitate to call me at [phone].  Sincerely,   Willie Foley, MD, PhD

## 2023-02-03 ENCOUNTER — Encounter: Payer: Self-pay | Admitting: Psychology

## 2023-02-03 ENCOUNTER — Telehealth: Payer: Self-pay | Admitting: Neurology

## 2023-02-03 ENCOUNTER — Telehealth: Payer: Self-pay | Admitting: *Deleted

## 2023-02-03 LAB — B12 AND FOLATE PANEL
Folate: 14.9 ng/mL (ref >3.0)
Vitamin B-12: 378 pg/mL (ref 232–1245)

## 2023-02-03 LAB — COMPREHENSIVE METABOLIC PANEL
ALT: 15 IU/L (ref 0–44)
AST: 22 IU/L (ref 0–40)
Albumin/Globulin Ratio: 2.3
Albumin: 4.3 g/dL (ref 3.8–4.8)
Alkaline Phosphatase: 77 IU/L (ref 44–121)
BUN/Creatinine Ratio: 15 (ref 10–24)
BUN: 16 mg/dL (ref 8–27)
Bilirubin Total: 0.4 mg/dL (ref 0.0–1.2)
CO2: 22 mmol/L (ref 20–29)
Calcium: 9.4 mg/dL (ref 8.6–10.2)
Chloride: 106 mmol/L (ref 96–106)
Creatinine, Ser: 1.06 mg/dL (ref 0.76–1.27)
Globulin, Total: 1.9 g/dL (ref 1.5–4.5)
Glucose: 97 mg/dL (ref 70–99)
Potassium: 4 mmol/L (ref 3.5–5.2)
Sodium: 141 mmol/L (ref 134–144)
Total Protein: 6.2 g/dL (ref 6.0–8.5)
eGFR: 73 mL/min/{1.73_m2} (ref >59)

## 2023-02-03 LAB — TSH: TSH: 2.77 u[IU]/mL (ref 0.450–4.500)

## 2023-02-03 NOTE — Telephone Encounter (Signed)
MR brain w/wo sent to GI for scheduling [phone].  No auth required for Medicare A&B.

## 2023-02-03 NOTE — Telephone Encounter (Signed)
Called pt VM not set up Checked DPR Dr Merri Brunette contact list . On DPR states text or email patient VM not set up  Will try again later

## 2023-02-03 NOTE — Telephone Encounter (Signed)
-----   Message from Saima Athar, MD sent at 02/03/2023  8:17 AM EDT ----- Please advise Dr. Kohout that his kidney function is fine, we will proceed with a brain MRI as discussed.  TSH was in the normal range, B12 on the lower end of the normal spectrum at 378, something worth monitoring with PCP, can be rechecked in 6 to 12 months, I recommend a diet higher in B12, such as beans, legumes, and meat.  

## 2023-02-04 ENCOUNTER — Telehealth: Payer: Self-pay

## 2023-02-04 NOTE — Telephone Encounter (Signed)
That was actually the 2nd attempt

## 2023-02-04 NOTE — Telephone Encounter (Signed)
-----   Message from [name], MD sent at 02/03/2023  8:17 AM EDT ----- Please advise Dr. Revonda Standard that his kidney function is fine, we will proceed with a brain MRI as discussed.  TSH was in the normal range, B12 on the lower end of the normal spectrum at 378, something worth monitoring with PCP, can be rechecked in 6 to 12 months, I recommend a diet higher in B12, such as beans, legumes, and meat.

## 2023-02-04 NOTE — Telephone Encounter (Signed)
1st attempt unable to leave msg  

## 2023-02-05 NOTE — Progress Notes (Signed)
                               Care Plan Summary  Name: Willie Farrell DOB: [birth year]   Your Medical Team:   Urologist -  Dr. Heloise Purpura, [facility]  Radiation Oncologist - Dr. Margaretmary Dys, [facility]     Recommendations: 1) Brachytherapy  2) Daily Radiation    * These recommendations are based on information available as of today's consult.      Recommendations may change depending on the results of further tests or exams.    Next Steps: 1) Consider your options and contact Marisue Ivan your nurse navigator with any questions or treatment decisions.     When appointments need to be scheduled, you will be contacted by [facility] and/or [facility].  Questions?  Please do not hesitate to call Cherlyn Cushing, BSN, RN at [phone] with any questions or concerns.  Marisue Ivan is your Oncology Nurse Navigator and is available to assist you while you're receiving your medical care at [facility].

## 2023-02-06 ENCOUNTER — Encounter: Payer: Self-pay | Admitting: Genetic Counselor

## 2023-02-06 ENCOUNTER — Other Ambulatory Visit: Payer: Self-pay

## 2023-02-06 ENCOUNTER — Encounter: Payer: Self-pay | Admitting: Radiation Oncology

## 2023-02-06 ENCOUNTER — Ambulatory Visit
Admission: RE | Admit: 2023-02-06 | Discharge: 2023-02-06 | Disposition: A | Discharge status: Home / Self Care | Payer: Medicare Other | Source: Ambulatory Visit | Attending: Radiation Oncology | Admitting: Radiation Oncology

## 2023-02-06 ENCOUNTER — Inpatient Hospital Stay: Payer: Medicare Other | Attending: Radiation Oncology | Admitting: Genetic Counselor

## 2023-02-06 VITALS — BP 108/70 | HR 69 | Temp 97.5°F | Resp 20 | Ht 72.0 in | Wt 222.4 lb

## 2023-02-06 DIAGNOSIS — C61 Malignant neoplasm of prostate: Secondary | ICD-10-CM

## 2023-02-06 DIAGNOSIS — Z800 Family history of malignant neoplasm of digestive organs: Secondary | ICD-10-CM

## 2023-02-06 DIAGNOSIS — R3915 Urgency of urination: Secondary | ICD-10-CM | POA: Diagnosis not present

## 2023-02-06 DIAGNOSIS — Z191 Hormone sensitive malignancy status: Secondary | ICD-10-CM | POA: Diagnosis not present

## 2023-02-06 DIAGNOSIS — N401 Enlarged prostate with lower urinary tract symptoms: Secondary | ICD-10-CM | POA: Diagnosis not present

## 2023-02-06 NOTE — Progress Notes (Signed)
REFERRING PROVIDER: Margaretmary Dys, MD  PRIMARY PROVIDER:  Merri Brunette, MD  PRIMARY REASON FOR VISIT:  1. Prostate cancer (HCC)   2. Family history of pancreatic cancer    HISTORY OF PRESENT ILLNESS:   Willie Farrell, a 77 y.o. male, was seen for a [place] cancer genetics consultation at the request of Dr. Kathrynn Running due to a personal and family history of cancer. Willie Farrell presents to clinic today to discuss the possibility of a hereditary predisposition to cancer, to discuss genetic testing, and to further clarify his future cancer risks, as well as potential cancer risks for family members.   In June 2022, at the age of 77, Willie Farrell was diagnosed with prostate cancer, Gleason 6.  CANCER HISTORY:  Oncology History  Prostate cancer (HCC)  03/19/2021 Initial Diagnosis   Prostate cancer (HCC)   12/22/2022 Cancer Staging   Staging form: Prostate, AJCC 8th Edition - Clinical stage from 12/22/2022: Stage IIB (cT1c, cN0, cM0, PSA: 7.4, Grade Group: 2) - Signed by Marcello Fennel, PA-C on 02/06/2023 Histopathologic type: Adenocarcinoma, NOS Stage prefix: Initial diagnosis Prostate specific antigen (PSA) range: Less than 10 Gleason primary pattern: 3 Gleason secondary pattern: 4 Gleason score: 7 Histologic grading system: 5 grade system Number of biopsy cores examined: 28 Number of biopsy cores positive: 16 Location of positive needle core biopsies: Both sides     Past Medical History:  Diagnosis Date   Actinic keratoses    Actinic keratoses    Anxiety    BPH (benign prostatic hyperplasia)    CAD (coronary artery disease)    Hypercholesteremia    Nocturia    Prostate cancer (HCC)    2023   Vitamin D deficiency     Past Surgical History:  Procedure Laterality Date   APPENDECTOMY     BASAL CELL CARCINOMA EXCISION     03/2018 side of nose   PROSTATE BIOPSY     Grade 6,   TONSILLECTOMY AND ADENOIDECTOMY      Social History   Socioeconomic History   Marital status:  Married    Spouse name: [name]   Number of children: 3   Years of education: MD   Highest education level: Not on file  Occupational History   Occupation: [employer]  Tobacco Use   Smoking status: Former    Packs/day: 0.50    Years: 2.00    Additional pack years: 0.00    Total pack years: 1.00    Types: Cigarettes    Quit date: 11/19/1968    Years since quitting: 54.2   Smokeless tobacco: Never   Tobacco comments:    only smoked in college  Vaping Use   Vaping Use: Never used  Substance and Sexual Activity   Alcohol use: Yes    Alcohol/week: 2.0 standard drinks of alcohol    Types: 2 Standard drinks or equivalent per week    Comment: occ   Drug use: No   Sexual activity: Not Currently  Other Topics Concern   Not on file  Social History Narrative   Drinks 2 cups of coffee a day    Lives wife.     Retired MD Endo/ PCP Diabetes specilsit   Social Determinants of Corporate investment banker Strain: Not on file  Food Insecurity: No Food Insecurity (02/06/2023)   Hunger Vital Sign    Worried About Running Out of Food in the Last Year: Never true    Ran Out of Food in the Last  Year: Never true  Transportation Needs: No Transportation Needs (02/06/2023)   PRAPARE - Administrator, Civil Service (Medical): No    Lack of Transportation (Non-Medical): No  Physical Activity: Not on file  Stress: Not on file  Social Connections: Not on file     FAMILY HISTORY:  We obtained a detailed, 4-generation family history.  Significant diagnoses are listed below: Family History  Problem Relation Age of Onset   Pneumonia Mother    Diabetes Father    Heart attack Father 13   Cancer Paternal Aunt        gynecological cancer   Pancreatic cancer Maternal Grandmother    Bladder Cancer Maternal Grandfather      Willie Farrell maternal grandmother was diagnosed with pancreatic cancer, she is deceased. His maternal grandfather was diagnosed with bladder cancer,  he is deceased. His paternal aunt was diagnosed with a gynecological cancer, possibly ovarian cancer, she is deceased. Willie Farrell is unaware of previous family history of genetic testing for hereditary cancer risks.   GENETIC COUNSELING ASSESSMENT: Willie Farrell is a 77 y.o. male with a personal and family history of cancer which is somewhat suggestive of a hereditary predisposition to cancer. We, therefore, discussed and recommended the following at today's visit.   DISCUSSION: We discussed that 5 - 10% of cancer is hereditary, with most cases of prostate cancer associated with BRCA1/2  There are other genes that can be associated with hereditary breast cancer syndromes.  We discussed that testing is beneficial for several reasons including knowing how to follow individuals after completing their treatment, identifying whether potential treatment options would be beneficial, and understanding if other family members could be at risk for cancer and allowing them to undergo genetic testing.   We reviewed the characteristics, features and inheritance patterns of hereditary cancer syndromes. We also discussed genetic testing, including the appropriate family members to test, the process of testing, insurance coverage and turn-around-time for results. We discussed the implications of a negative, positive, carrier and/or variant of uncertain significant result.   Based on Willie Farrell personal and family history of cancer, he meets medical criteria for genetic testing. Despite that he meets criteria, he may still have an out of pocket cost. We discussed that if his out of pocket cost for testing is over $100, the laboratory will call and confirm whether he wants to proceed with testing.  If the out of pocket cost of testing is less than $100 he will be billed by the genetic testing laboratory.   PLAN: Despite our recommendation, Willie Farrell did not wish to pursue genetic testing at today's visit. We understand this  decision and remain available to coordinate genetic testing at any time in the future. We, therefore, recommend Willie Farrell continue to follow the cancer screening guidelines given by his primary healthcare provider.  Willie Farrell questions were answered to his satisfaction today. Our contact information was provided should additional questions or concerns arise. Thank you for the referral and allowing [place] to share in the care of your patient.   Lalla Brothers, MS, [facility] Genetic Counselor [email or web address] ([phone]  The patient was seen for a total of <15 minutes in face-to-face genetic counseling. The patient was seen alone.  Drs. Pamelia Hoit and/or Mosetta Putt were available to discuss this case as needed.   _______________________________________________________________________ For Office Staff:  Number of people involved in session: 1 Was an Intern/ student involved with case: no

## 2023-02-06 NOTE — Progress Notes (Signed)
Radiation Oncology         [phone] ________________________________  Multidisciplinary Prostate Cancer Clinic  Radiation Oncology Consultation  Name: Willie Farrell MRN: [id number]  Date: 02/06/2023  DOB: 1945/10/08  GE:[id number], Willie Beckers, MD  Willie Purpura, MD   REFERRING PHYSICIAN: Heloise Purpura, MD  DIAGNOSIS: 77 y.o. gentleman with stage T1c adenocarcinoma of the prostate with a Gleason's score of 3+4 and a PSA of 7.41    ICD-10-CM   1. Malignant neoplasm of prostate (HCC)  C61       HISTORY OF PRESENT ILLNESS::Willie Farrell is a 77 y.o. gentleman.  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 He was seen in our multidisciplinary prostate cancer clinic on 03/19/21. At that time, he opted for active surveillance.  His PSA had decreased to 5.49 in February 2023 but back up to 6.83 in September 2023 and most recent PSA from 10/09/22 was 7.41. He underwent surveillance prostate biopsy on 11/10/22 showing: three PI-RADS 4 lesions and a PI-RADS 5 lesion of the right peripheral zone. Transcapsular spread was not directly visualized, although ROI #2 (in the right posterior lateral and posterior medial peripheral zone in the mid gland and base) has >1.5 cm of capsular contact. The patient proceeded to MRI fusion biopsy of the prostate on 12/22/22.  The prostate volume measured 41.2 cc.  Out of 28 core biopsies, 14 were positive.  The maximum Gleason score was 3+4, and this was seen in three cores from ROI #3 (left posterior lateral peripheral zone mid gland/apex), all four cores from ROI #4 (right anterior transitional zone mid gland and base), as well as in the left mid, and right mid. Additionally, Gleason 3+3 was seen in the right base lateral, right apex (small focus), right base, left mid lateral (small focus), and one core from ROI #2 (small focus).    Dr. Juleen Farrell  reviewed the biopsy results with his urologist and he has kindly been referred today to the multidisciplinary prostate cancer clinic for presentation of pathology and radiology studies in our conference for discussion of potential radiation treatment options and clinical evaluation.  PREVIOUS RADIATION THERAPY: No  PAST MEDICAL HISTORY:  has a past medical history of Actinic keratoses, Actinic keratoses, Anxiety, BPH (benign prostatic hyperplasia), CAD (coronary artery disease), Hypercholesteremia, Nocturia, Prostate cancer (HCC), and Vitamin D deficiency.    PAST SURGICAL HISTORY: Past Surgical History:  Procedure Laterality Date   APPENDECTOMY     BASAL CELL CARCINOMA EXCISION     03/2018 side of nose   PROSTATE BIOPSY     Grade 6,   TONSILLECTOMY AND ADENOIDECTOMY      FAMILY HISTORY: family history includes Bladder Cancer in his maternal grandfather; Cancer in his paternal aunt; Diabetes in his father; Heart attack (age of onset: 6) in his father; Pancreatic cancer in his maternal grandmother; Pneumonia in his mother.  SOCIAL HISTORY:  reports that he quit smoking about 54 years ago. His smoking use included cigarettes. He has a 1.00 pack-year smoking history. He has never used smokeless tobacco. He reports current alcohol use of about 2.0 standard drinks of alcohol per week. He reports that he does not use drugs.  ALLERGIES: Patient has no known allergies.  MEDICATIONS:  Current Outpatient Medications  Medication Sig Dispense Refill   fluorouracil (EFUDEX) 5 % cream Apply topically daily.     tadalafil (CIALIS) 5 MG tablet Take 5  mg by mouth daily as needed.     ascorbic acid (VITAMIN C) 500 MG tablet Take 1 tablet by mouth daily.     aspirin 81 MG chewable tablet Chew 1 tablet by mouth daily.     cholecalciferol (VITAMIN D3) 25 MCG (1000 UNIT) tablet Take 1,000 Units by mouth daily.     mirabegron ER (MYRBETRIQ) 50 MG TB24 tablet Take by mouth.     modafinil (PROVIGIL) 200 MG  tablet Take 100-200 mg by mouth daily.     rosuvastatin (CRESTOR) 20 MG tablet Take by mouth daily.     tamsulosin (FLOMAX) 0.4 MG CAPS capsule Take by mouth.     No current facility-administered medications for this encounter.    REVIEW OF SYSTEMS:  On review of systems, the patient reports that he is doing well overall. He denies any chest pain, shortness of breath, cough, fevers, chills, night sweats, unintended weight changes. He denies any bowel disturbances, and denies abdominal pain, nausea or vomiting. He denies any new musculoskeletal or joint aches or pains. His IPSS was 14, indicating moderate urinary symptoms with urgency, frequency and nocturia x 4 but no obstructive voiding symptoms. His SHIM was 3, indicating he has severe erectile dysfunction. A complete review of systems is obtained and is otherwise negative.   PHYSICAL EXAM:  Wt Readings from Last 3 Encounters:  02/06/23 222 lb 6.4 oz (100.9 kg)  02/02/23 221 lb 12.8 oz (100.6 kg)  10/01/22 225 lb (102.1 kg)   Temp Readings from Last 3 Encounters:  02/06/23 (!) 97.5 F (36.4 C)  06/27/22 97.8 F (36.6 C) (Temporal)  12/25/21 98.6 F (37 C) (Temporal)   BP Readings from Last 3 Encounters:  02/06/23 108/70  02/02/23 97/62  10/01/22 119/77   Pulse Readings from Last 3 Encounters:  02/06/23 69  02/02/23 (!) 56  10/01/22 66    /10  In general this is a well appearing Caucasian man in no acute distress. He's alert and oriented x4 and appropriate throughout the examination. Cardiopulmonary assessment is negative for acute distress and he exhibits normal effort.    KPS = 100  100 - Normal; no complaints; no evidence of disease. 90   - Able to carry on normal activity; minor signs or symptoms of disease. 80   - Normal activity with effort; some signs or symptoms of disease. 73   - Cares for self; unable to carry on normal activity or to do active work. 60   - Requires occasional assistance, but is able to care for  most of his personal needs. 50   - Requires considerable assistance and frequent medical care. 40   - Disabled; requires special care and assistance. 30   - Severely disabled; hospital admission is indicated although death not imminent. 20   - Very sick; hospital admission necessary; active supportive treatment necessary. 10   - Moribund; fatal processes progressing rapidly. 0     - Dead  Karnofsky DA, Abelmann WH, Craver LS and Burchenal [facility] [phone]) The use of the nitrogen mustards in the palliative treatment of carcinoma: with particular reference to bronchogenic carcinoma Cancer 1 [phone]   LABORATORY DATA:  Lab Results  Component Value Date   WBC 6.7 09/22/2009   HGB 14.6 09/22/2009   HCT 43.8 09/22/2009   MCV 91.3 09/22/2009   PLT 204 09/22/2009   Lab Results  Component Value Date   NA 141 02/02/2023   K 4.0 02/02/2023   CL 106 02/02/2023   CO2  22 02/02/2023   Lab Results  Component Value Date   ALT 15 02/02/2023   AST 22 02/02/2023   ALKPHOS 77 02/02/2023   BILITOT 0.4 02/02/2023     RADIOGRAPHY: No results found.    IMPRESSION/PLAN: 77 y.o. gentleman with Stage T1c adenocarcinoma of the prostate with a Gleason score of 3+4 and a PSA of 7.41.    We discussed the patient's workup and outlined the nature of prostate cancer in this setting. The patient's T stage, Gleason's score, and PSA put him into the favorable intermediate risk group. Accordingly, he is eligible for a variety of potential treatment options including brachytherapy, 5.5 weeks of external radiation, or prostatectomy. We discussed the available radiation techniques, and focused on the details and logistics of delivery. We discussed and outlined the risks, benefits, short and long-term effects associated with radiotherapy and compared and contrasted these with prostatectomy. We discussed the role of SpaceOAR gel in reducing the rectal toxicity associated with radiotherapy. He appears to have a good understanding  of his disease and our treatment recommendations which are of curative intent.  He was encouraged to ask questions that were answered to his stated satisfaction.  At the end of the conversation, Dr. Juleen Farrell remains undecided but appears to be leaning toward brachytherapy.  He would like to take some additional time to consider his treatment options and has our contact information to let us know once he reaches a final decision so that we can proceed with treatment planning accordingly at that time.  We enjoyed meeting with him again today and look forward to continuing to participate in his care.  We personally spent 60 minutes in this encounter including chart review, reviewing radiological studies, meeting face-to-face with the patient, entering orders and completing documentation.    Marguarite Arbour, PA-C    Margaretmary Dys, MD  [facility]  Radiation Oncology Direct Dial: [phone]  Fax: [phone] [email or web address]  Skype  [email or web address]   This document serves as a record of services personally performed by Margaretmary Dys, MD and Marcello Fennel, PA-C. It was created on their behalf by Mickie Bail, a trained medical scribe. The creation of this record is based on the scribe's personal observations and the provider's statements to them. This document has been checked and approved by the attending provider.

## 2023-02-10 ENCOUNTER — Ambulatory Visit
Admission: RE | Admit: 2023-02-10 | Discharge: 2023-02-10 | Disposition: A | Discharge status: Home / Self Care | Payer: Medicare Other | Source: Ambulatory Visit | Attending: Neurology | Admitting: Neurology

## 2023-02-10 DIAGNOSIS — Z818 Family history of other mental and behavioral disorders: Secondary | ICD-10-CM

## 2023-02-10 DIAGNOSIS — R269 Unspecified abnormalities of gait and mobility: Secondary | ICD-10-CM

## 2023-02-10 DIAGNOSIS — R413 Other amnesia: Secondary | ICD-10-CM

## 2023-02-10 DIAGNOSIS — C61 Malignant neoplasm of prostate: Secondary | ICD-10-CM

## 2023-02-10 MED ORDER — GADOPICLENOL 0.5 MMOL/ML IV SOLN
10.0000 mL | Freq: Once | INTRAVENOUS | Status: AC | PRN
  Administered 2023-02-10: 10 mL via INTRAVENOUS

## 2023-02-16 NOTE — Progress Notes (Signed)
RN reached out to patient to follow up from recent [facility] appointment.  No answer, voicemail has not been set up.  RN will continue to follow up.

## 2023-02-17 ENCOUNTER — Telehealth: Payer: Self-pay

## 2023-02-17 NOTE — Telephone Encounter (Signed)
-----   Message from [name], MD sent at 02/12/2023  8:05 AM EDT ----- Please call [name] and advise him that his brain MRI did not show any acute findings and overall mild atrophy and mild vascular changes, nothing alarming.

## 2023-02-17 NOTE — Telephone Encounter (Signed)
Multiple attempts have been made to contact the patient. His voicemail is not set up. I have printed the patient's results and will place them in outgoing mail.

## 2023-02-17 NOTE — Telephone Encounter (Signed)
The patient returned my call. The out letter has been removed from the basket. I informed him of the results. He verbalized understanding and expressed appreciation for the call. He is requesting that results be sent to Dr. Merri Brunette. I will fax results.

## 2023-02-20 NOTE — Progress Notes (Signed)
RN spoke with patient to follow up from recent [facility] visit on 02/06/23.  Patient had several questions regarding the daily radiation.  RN provided education.  Patient is still wanting to take some time to think over his treatment options.  He plans to have treatment after labor day.  He is agreeable for me to follow up in several weeks.  RN encouraged him to reach out with any additional questions or if he decided treatment decision prior.

## 2023-03-12 DIAGNOSIS — Z86010 Personal history of colonic polyps: Secondary | ICD-10-CM | POA: Diagnosis not present

## 2023-03-12 DIAGNOSIS — K5790 Diverticulosis of intestine, part unspecified, without perforation or abscess without bleeding: Secondary | ICD-10-CM | POA: Diagnosis not present

## 2023-03-16 NOTE — Progress Notes (Signed)
RN placed call to patient to follow up to review treatment options/decision.   No answer, voicemail box has not been set up.  RN will continue to reach out to ensure treatment decision is finalized.

## 2023-03-20 NOTE — Progress Notes (Signed)
RN spoke with patient to review any questions or treatment decisions.  Patient did have questions regarding SBRT and concerns with side effects of radiation related to urinary frequency.    Pt had additional questions however had these written down at home.  Pt will call back with questions to review.

## 2023-03-24 DIAGNOSIS — K648 Other hemorrhoids: Secondary | ICD-10-CM | POA: Diagnosis not present

## 2023-03-24 DIAGNOSIS — D123 Benign neoplasm of transverse colon: Secondary | ICD-10-CM | POA: Diagnosis not present

## 2023-03-24 DIAGNOSIS — Z86010 Personal history of colonic polyps: Secondary | ICD-10-CM | POA: Diagnosis not present

## 2023-03-24 DIAGNOSIS — Z09 Encounter for follow-up examination after completed treatment for conditions other than malignant neoplasm: Secondary | ICD-10-CM | POA: Diagnosis not present

## 2023-03-24 DIAGNOSIS — K5730 Diverticulosis of large intestine without perforation or abscess without bleeding: Secondary | ICD-10-CM | POA: Diagnosis not present

## 2023-03-26 DIAGNOSIS — D123 Benign neoplasm of transverse colon: Secondary | ICD-10-CM | POA: Diagnosis not present

## 2023-03-26 HISTORY — PX: COLONOSCOPY: SHX174

## 2023-03-31 NOTE — Progress Notes (Signed)
RN spoke with patient regarding questions regarding Prostox, and specific questions about pathology.   Patient will be providing some resources he would appreciate having reviewed about the Prostox.  Once received RN will review with MD, and will review pathology questions at that time as well.  Pt aware.

## 2023-03-31 NOTE — Progress Notes (Signed)
RN attempted to reach out to patient to inform him that I did not receive the information via e-mail.     Voicemail box has not been set up, will attempt again at a later time.

## 2023-04-01 ENCOUNTER — Ambulatory Visit: Payer: Medicare Other | Admitting: Cardiology

## 2023-04-07 ENCOUNTER — Encounter: Payer: Self-pay | Admitting: Cardiology

## 2023-04-07 ENCOUNTER — Ambulatory Visit: Payer: Medicare Other | Admitting: Cardiology

## 2023-04-07 VITALS — BP 113/75 | HR 67 | Resp 16 | Ht 72.0 in | Wt 219.8 lb

## 2023-04-07 DIAGNOSIS — I2510 Atherosclerotic heart disease of native coronary artery without angina pectoris: Secondary | ICD-10-CM

## 2023-04-07 DIAGNOSIS — I452 Bifascicular block: Secondary | ICD-10-CM

## 2023-04-07 MED ORDER — ROSUVASTATIN CALCIUM 20 MG PO TABS: 20.0000 mg | ORAL_TABLET | Freq: Every day | ORAL | 3 refills | Status: DC

## 2023-04-07 NOTE — Progress Notes (Signed)
Primary Physician/Referring:  Merri Brunette, MD  Patient ID: Willie Farrell, male    DOB: 1946/05/23, 77 y.o.   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 He is easily fatigued with exertional activity, decreased exercise tolerance and has not had any frank chest pain.  He has now been diagnosed with possible early prostate cancer and is contemplating surgical versus radiation therapy.  He had questions regarding this and cardiac issues.  Past Medical History:  Diagnosis Date   Actinic keratoses    Actinic keratoses    Anxiety    BPH (benign prostatic hyperplasia)    CAD (coronary artery disease)    Hypercholesteremia    Nocturia    Prostate cancer (HCC)    2023   Vitamin D deficiency    Past Surgical History:  Procedure Laterality Date   APPENDECTOMY     BASAL CELL CARCINOMA EXCISION     03/2018 side of nose   PROSTATE BIOPSY     Grade 6,   TONSILLECTOMY AND ADENOIDECTOMY     Family History  Problem Relation Age of Onset   Pneumonia Mother    Diabetes Father    Heart attack Father 25   Cancer Paternal Aunt        gynecological cancer   Pancreatic cancer Maternal Grandmother    Bladder Cancer Maternal Grandfather     Social History   Tobacco Use   Smoking status: Former    Current packs/day: 0.00    Average packs/day: 0.5 packs/day for 2.0 years (1.0 ttl pk-yrs)    Types: Cigarettes    Start date: 11/20/1966    Quit date: 11/19/1968    Years since quitting: 54.4   Smokeless tobacco: Never   Tobacco comments:    only smoked in college  Substance Use Topics   Alcohol use: Yes    Alcohol/week: 2.0 standard drinks of alcohol    Types: 2 Standard drinks or equivalent per week     Comment: occ   Marital Status: Married  ROS  Review of Systems  Constitutional: Positive for malaise/fatigue.  Cardiovascular:  Negative for chest pain, dyspnea on exertion and leg swelling.  Genitourinary:  Positive for frequency.   Objective  Blood pressure 113/75, pulse 67, resp. rate 16, height 6' (1.829 m), weight 219 lb 12.8 oz (99.7 kg), SpO2 94%.     04/07/2023   11:30 AM 02/06/2023    8:40 AM 02/02/2023    8:10 AM  Vitals with BMI  Height 6\' 0"  6\' 0"  6\' 0"   Weight 219 lbs 13 oz 222 lbs 6 oz 221 lbs 13 oz  BMI 29.8 30.16 30.07  Systolic 113 108 97  Diastolic 75 70 62  Pulse 67 69 56     Physical Exam Neck:     Vascular: No carotid bruit or JVD.  Cardiovascular:     Rate and Rhythm: Normal rate and regular rhythm.     Pulses: Intact distal pulses.     Heart sounds: Normal heart sounds. No murmur heard.    No gallop.  Pulmonary:     Effort: Pulmonary effort is normal.     Breath sounds: Normal breath sounds.  Abdominal:     General:  Bowel sounds are normal.     Palpations: Abdomen is soft.  Musculoskeletal:     Right lower leg: No edema.     Left lower leg: No edema.    Laboratory examination:   External labs:   Labs 11/11/2021:  Total cholesterol 129, triglycerides 138, HDL 57, LDL 44.  Non-HDL cholesterol 72.  Hb 15.2/HCT 45.7, platelets 200.  Normal indicis.  BUN 18, creatinine 0.97, EGFR 76 mL, LFTs normal.   Labs 12/18/2016: TSH normal.  Medications and allergies  No Known Allergies    Current Outpatient Medications:    ascorbic acid (VITAMIN C) 500 MG tablet, Take 1 tablet by mouth daily., Disp: , Rfl:    aspirin 81 MG chewable tablet, Chew 1 tablet by mouth daily., Disp: , Rfl:    cholecalciferol (VITAMIN D3) 25 MCG (1000 UNIT) tablet, Take 1,000 Units by mouth daily., Disp: , Rfl:    rosuvastatin (CRESTOR) 20 MG tablet, Take 1 tablet (20 mg total) by mouth daily., Disp: 90 tablet, Rfl: 3   Radiology:   CT chest without contrast  11/11/2021: Normal cardiac size and configuration, significant three-vessel coronary artery calcification, stable from prior CT performed 10/22/2020.  Great vessels are normal in caliber with minor atherosclerotic changes. No significant extracardiac abnormality.  Cardiac Studies:   Carotid artery duplex 2016-03-10: No hemodynamically significant arterial disease in the internal carotid artery bilaterally. Normal study without any significant plaque burden. Antegrade right vertebral artery flow. Antegrade left vertebral artery flow.  Coronary calcium score 10/22/2020:  Total Agatston score 2369.  MeSA database percentile 92. Left Main: 0 LAD: 729 LCx: 835 RCA: 806 Ascending aorta measures 39 mm.  Lexiscan Tetrofosmin stress test 12/03/2020: Lexiscan nuclear stress test performed using 1-day protocol. SPECT images show small sized, mild intensity perfusion defect on rest and stress images, with no significant reversibility. In absence of wall motion abnormality, the changes likely suggest tissue attenuation, but ischemia cannot be excluded. Clinical correlation recommended. Stress LVEF 55%.  Echocardiogram 12/04/2020: Normal LV systolic function with visual EF 55-60%. Left ventricle cavity is normal in size. Normal global wall motion. Normal diastolic filling pattern, normal LAP.  Mild tricuspid regurgitation. No evidence of pulmonary hypertension. Compared to prior study dated 2016/03/10: no significant change.   EKG:   EKG 04/07/2023: Sinus bradycardia at rate of 53 beats provide, left intrafascicular block.  Incomplete right bundle branch block.  No evidence of ischemia.  Compared to 06/27/2022, no significant change.  Heart rate has reduced from 72 bpm.    Assessment     ICD-10-CM   1. Coronary artery disease involving native coronary artery of native heart without angina pectoris  I25.10 EKG 12-Lead    2. Bifascicular block  I45.2       Medications Discontinued During This  Encounter  Medication Reason   tamsulosin (FLOMAX) 0.4 MG CAPS capsule    tadalafil (CIALIS) 5 MG tablet    fluorouracil (EFUDEX) 5 % cream    mirabegron ER (MYRBETRIQ) 50 MG TB24 tablet    modafinil (PROVIGIL) 200 MG tablet    rosuvastatin (CRESTOR) 20 MG tablet Reorder    Meds ordered this encounter  Medications   rosuvastatin (CRESTOR) 20 MG tablet    Sig: Take 1 tablet (20 mg total) by mouth daily.    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    1. Coronary artery disease involving native coronary artery of native heart without angina pectoris Patient with stable coronary disease, is now been diagnosed with what appears to be prostate cancer and is undergoing evaluation for the same.  Extensive discussion was held regarding management of the same and from cardiac standpoint I reassured him that he would be at low risk for any kind of cardiac complications if he were to proceed with surgery.  He is contemplating going through radiation therapy.  From CAD standpoint he is tolerating aspirin and also Crestor without any side effects. - EKG 12-Lead  2. Bifascicular block No change in his bifascicular block.  I discussed with the patient that he is now 77 years of age, previously very active physician, we discussed regarding dementia, decreased exercise capacity and deconditioning, I encouraged him to do cardiac exercises or cardio activity at least 30 minutes on a daily basis.  No changes in the medications were done today.  I will see him back in 6 months as he would like me to follow-up on his prostate issues as well.  Other orders - rosuvastatin (CRESTOR) 20 MG tablet; Take 1 tablet (20 mg total) by mouth daily.  Dispense: 90  tablet; Refill: 3      Yates Decamp, MD, [facility] 04/10/2023, 4:52 PM Office: [phone]

## 2023-04-22 NOTE — Progress Notes (Signed)
RN spoke with patient regarding treatment decision.  Patient has several questions related to daily radiation vs SBRT.   Patient does wish to pursue fiducial marker's and spaceOAR by Dr. Laverle Patter regardless of which radiation treatment he commits too.  Patient is planning to reach out to [facility] and [facility] regarding consult about SBRT and will reach back out with decision on if he would like to move forward with referral.   RN will notify MD's. Will continue to follow.

## 2023-04-30 ENCOUNTER — Other Ambulatory Visit: Payer: Self-pay

## 2023-04-30 DIAGNOSIS — C61 Malignant neoplasm of prostate: Secondary | ICD-10-CM

## 2023-05-01 NOTE — Progress Notes (Addendum)
Per patient request referrals sent to [facility] and [facility] for second opinion.  RN placed call to ensure referrals were received.  RN attempted call to patient to update, no answer, no voicemail box at this time.  Will continue to reach out.  RN was able to speak with patient and updated him that referrals have been received.  Answered questions related to the fiducial marker's and spaceOAR gel.  Will follow up to ensure second opinions get scheduled.  No additional needs at this time.

## 2023-05-06 ENCOUNTER — Telehealth: Payer: Self-pay | Admitting: *Deleted

## 2023-05-06 NOTE — Telephone Encounter (Signed)
CALLED PATIENT TO ASK IF HE HAS MADE A DECISION ABOUT HAVING TX. HERE, PATIENT STATES THAT HE IS WANTING A SECOND OPINION, BUT HASN'T THIS YET, NOTIFIED LIZ SHUMATE PROSTATE NAVIGATOR

## 2023-05-07 NOTE — Progress Notes (Signed)
Patient is scheduled with [facility], Dr. Estelle Grumbles, on 10/1 @ 3pm.   Still pending appointment with [facility].  RN confirmed referral was received.   Contacted patient, no answer, voicemail not set up.  Will attempt again at later time.

## 2023-05-11 DIAGNOSIS — C61 Malignant neoplasm of prostate: Secondary | ICD-10-CM | POA: Diagnosis not present

## 2023-05-14 ENCOUNTER — Other Ambulatory Visit: Payer: Self-pay | Admitting: Urology

## 2023-05-14 ENCOUNTER — Encounter (HOSPITAL_BASED_OUTPATIENT_CLINIC_OR_DEPARTMENT_OTHER): Payer: Self-pay | Admitting: Urology

## 2023-05-14 DIAGNOSIS — C61 Malignant neoplasm of prostate: Secondary | ICD-10-CM | POA: Diagnosis not present

## 2023-05-14 DIAGNOSIS — N401 Enlarged prostate with lower urinary tract symptoms: Secondary | ICD-10-CM | POA: Diagnosis not present

## 2023-05-14 NOTE — Progress Notes (Signed)
RN reached out to patient to review treatment decision after recent consults at [facility] and [facility].    No answer, voicemail not set up.

## 2023-05-14 NOTE — Progress Notes (Signed)
Spoke w/ via phone for pre-op interview--- pt Lab needs dos----  no       Lab results------  no COVID test -----patient states asymptomatic no test needed Arrive at ------- 0530 on 05-22-2023 NPO after MN NO Solid Food.  Clear liquids from MN until--- 0430 Med rec completed Medications to take morning of surgery ----- none Diabetic medication ----- n/a Patient instructed no nail polish to be worn day of surgery Patient instructed to bring photo id and insurance card day of surgery Patient aware to have Driver (ride ) / caregiver    for 24 hours after surgery - wife, teresa Patient Special Instructions ----- will do one fleet enema night before surgery Pre-Op special Instructions ----- n/a Patient verbalized understanding of instructions that were given at this phone interview. Patient denies chest pain, sob, fever, cough at the interview.

## 2023-05-22 ENCOUNTER — Ambulatory Visit (HOSPITAL_BASED_OUTPATIENT_CLINIC_OR_DEPARTMENT_OTHER)
Admission: RE | Admit: 2023-05-22 | Discharge: 2023-05-22 | Disposition: A | Discharge status: Home / Self Care | Payer: Medicare Other | Attending: Urology | Admitting: Urology

## 2023-05-22 ENCOUNTER — Encounter (HOSPITAL_BASED_OUTPATIENT_CLINIC_OR_DEPARTMENT_OTHER): Payer: Self-pay | Admitting: Urology

## 2023-05-22 ENCOUNTER — Ambulatory Visit (HOSPITAL_BASED_OUTPATIENT_CLINIC_OR_DEPARTMENT_OTHER): Payer: Medicare Other | Admitting: Anesthesiology

## 2023-05-22 ENCOUNTER — Encounter (HOSPITAL_BASED_OUTPATIENT_CLINIC_OR_DEPARTMENT_OTHER)
Admission: RE | Disposition: A | Discharge status: Home / Self Care | Payer: Self-pay | Source: Home / Self Care | Attending: Urology

## 2023-05-22 ENCOUNTER — Other Ambulatory Visit: Payer: Self-pay

## 2023-05-22 DIAGNOSIS — K219 Gastro-esophageal reflux disease without esophagitis: Secondary | ICD-10-CM | POA: Diagnosis not present

## 2023-05-22 DIAGNOSIS — Z87891 Personal history of nicotine dependence: Secondary | ICD-10-CM | POA: Insufficient documentation

## 2023-05-22 DIAGNOSIS — C61 Malignant neoplasm of prostate: Secondary | ICD-10-CM | POA: Insufficient documentation

## 2023-05-22 DIAGNOSIS — I071 Rheumatic tricuspid insufficiency: Secondary | ICD-10-CM | POA: Insufficient documentation

## 2023-05-22 DIAGNOSIS — F32A Depression, unspecified: Secondary | ICD-10-CM | POA: Insufficient documentation

## 2023-05-22 DIAGNOSIS — I2510 Atherosclerotic heart disease of native coronary artery without angina pectoris: Secondary | ICD-10-CM | POA: Insufficient documentation

## 2023-05-22 DIAGNOSIS — Z01818 Encounter for other preprocedural examination: Secondary | ICD-10-CM

## 2023-05-22 DIAGNOSIS — E7800 Pure hypercholesterolemia, unspecified: Secondary | ICD-10-CM | POA: Insufficient documentation

## 2023-05-22 DIAGNOSIS — F419 Anxiety disorder, unspecified: Secondary | ICD-10-CM | POA: Insufficient documentation

## 2023-05-22 DIAGNOSIS — E785 Hyperlipidemia, unspecified: Secondary | ICD-10-CM | POA: Diagnosis not present

## 2023-05-22 HISTORY — DX: Personal history of other malignant neoplasm of skin: Z85.828

## 2023-05-22 HISTORY — DX: Unspecified abnormalities of gait and mobility: R26.9

## 2023-05-22 HISTORY — PX: GOLD SEED IMPLANT: SHX6343

## 2023-05-22 HISTORY — DX: Personal history of adenomatous and serrated colon polyps: Z86.0101

## 2023-05-22 HISTORY — DX: Benign prostatic hyperplasia with lower urinary tract symptoms: N40.1

## 2023-05-22 HISTORY — DX: Personal history of colonic polyps: Z86.010

## 2023-05-22 HISTORY — DX: Unspecified hearing loss, unspecified ear: H91.90

## 2023-05-22 HISTORY — DX: Presence of spectacles and contact lenses: Z97.3

## 2023-05-22 HISTORY — PX: SPACE OAR INSTILLATION: SHX6769

## 2023-05-22 SURGERY — INSERTION, GOLD SEEDS
Anesthesia: Monitor Anesthesia Care | Site: Prostate

## 2023-05-22 MED ORDER — LIDOCAINE HCL (PF) 2 % IJ SOLN
INTRAMUSCULAR | Status: AC
  Filled 2023-05-22: qty 5

## 2023-05-22 MED ORDER — CEFAZOLIN SODIUM-DEXTROSE 2-4 GM/100ML-% IV SOLN
INTRAVENOUS | Status: AC
  Filled 2023-05-22: qty 100

## 2023-05-22 MED ORDER — DROPERIDOL 2.5 MG/ML IJ SOLN: 0.6250 mg | Freq: Once | INTRAMUSCULAR | Status: DC | PRN

## 2023-05-22 MED ORDER — FENTANYL CITRATE (PF) 100 MCG/2ML IJ SOLN
INTRAMUSCULAR | Status: AC
  Filled 2023-05-22: qty 2

## 2023-05-22 MED ORDER — PROPOFOL 500 MG/50ML IV EMUL
INTRAVENOUS | Status: AC
  Filled 2023-05-22: qty 50

## 2023-05-22 MED ORDER — SODIUM CHLORIDE (PF) 0.9 % IJ SOLN
INTRAMUSCULAR | Status: DC | PRN
  Administered 2023-05-22: 10 mL

## 2023-05-22 MED ORDER — ACETAMINOPHEN 500 MG PO TABS
ORAL_TABLET | ORAL | Status: AC
  Filled 2023-05-22: qty 2

## 2023-05-22 MED ORDER — OXYCODONE HCL 5 MG PO TABS: 5.0000 mg | ORAL_TABLET | Freq: Once | ORAL | Status: DC | PRN

## 2023-05-22 MED ORDER — LACTATED RINGERS IV SOLN: INTRAVENOUS | Status: DC

## 2023-05-22 MED ORDER — FENTANYL CITRATE (PF) 100 MCG/2ML IJ SOLN
INTRAMUSCULAR | Status: DC | PRN
  Administered 2023-05-22: 50 ug via INTRAVENOUS

## 2023-05-22 MED ORDER — OXYCODONE HCL 5 MG/5ML PO SOLN: 5.0000 mg | Freq: Once | ORAL | Status: DC | PRN

## 2023-05-22 MED ORDER — FENTANYL CITRATE (PF) 100 MCG/2ML IJ SOLN: 25.0000 ug | INTRAMUSCULAR | Status: DC | PRN

## 2023-05-22 MED ORDER — PROPOFOL 500 MG/50ML IV EMUL
INTRAVENOUS | Status: DC | PRN
  Administered 2023-05-22: 40 mg via INTRAVENOUS
  Administered 2023-05-22: 200 ug/kg/min via INTRAVENOUS

## 2023-05-22 MED ORDER — BUPIVACAINE HCL (PF) 0.25 % IJ SOLN
INTRAMUSCULAR | Status: DC | PRN
  Administered 2023-05-22: 10 mL

## 2023-05-22 MED ORDER — PROPOFOL 10 MG/ML IV BOLUS
INTRAVENOUS | Status: AC
  Filled 2023-05-22: qty 20

## 2023-05-22 MED ORDER — ACETAMINOPHEN 500 MG PO TABS
1000.0000 mg | ORAL_TABLET | Freq: Once | ORAL | Status: AC
  Administered 2023-05-22: 1000 mg via ORAL

## 2023-05-22 MED ORDER — CEFAZOLIN SODIUM-DEXTROSE 2-4 GM/100ML-% IV SOLN
2.0000 g | INTRAVENOUS | Status: AC
  Administered 2023-05-22: 2 g via INTRAVENOUS

## 2023-05-22 SURGICAL SUPPLY — 26 items
BLADE CLIPPER SENSICLIP SURGIC (BLADE) ×1 IMPLANT
CNTNR URN SCR LID CUP LEK RST (MISCELLANEOUS) ×1 IMPLANT
CONT SPEC 4OZ STRL OR WHT (MISCELLANEOUS) ×1
COVER BACK TABLE 60X90IN (DRAPES) ×1 IMPLANT
DRSG TEGADERM 4X4.75 (GAUZE/BANDAGES/DRESSINGS) ×1 IMPLANT
DRSG TEGADERM 8X12 (GAUZE/BANDAGES/DRESSINGS) ×1 IMPLANT
GAUZE SPONGE 4X4 12PLY STRL (GAUZE/BANDAGES/DRESSINGS) ×1 IMPLANT
GLOVE BIO SURGEON STRL SZ7.5 (GLOVE) ×1 IMPLANT
GLOVE BIOGEL PI IND STRL 6.5 (GLOVE) IMPLANT
GLOVE SURG ORTHO 8.5 STRL (GLOVE) ×1 IMPLANT
IMPL SPACEOAR VUE SYSTEM (Spacer) ×1 IMPLANT
IMPLANT SPACEOAR VUE SYSTEM (Spacer) ×1 IMPLANT
KIT TURNOVER CYSTO (KITS) ×1 IMPLANT
MARKER GOLD PRELOAD 1.2X3 (Urological Implant) ×1 IMPLANT
MARKER SKIN DUAL TIP RULER LAB (MISCELLANEOUS) ×1 IMPLANT
NDL SPNL 22GX3.5 QUINCKE BK (NEEDLE) ×1 IMPLANT
NEEDLE SPNL 22GX3.5 QUINCKE BK (NEEDLE) ×1
SEED GOLD PRELOAD 1.2X3 (Urological Implant) ×1 IMPLANT
SHEATH ULTRASOUND LF (SHEATH) IMPLANT
SHEATH ULTRASOUND LTX NONSTRL (SHEATH) IMPLANT
SLEEVE SCD COMPRESS KNEE MED (STOCKING) ×1 IMPLANT
SURGILUBE 2OZ TUBE FLIPTOP (MISCELLANEOUS) ×1 IMPLANT
SYR 10ML LL (SYRINGE) ×1 IMPLANT
SYR CONTROL 10ML LL (SYRINGE) ×1 IMPLANT
TOWEL OR 17X24 6PK STRL BLUE (TOWEL DISPOSABLE) ×1 IMPLANT
UNDERPAD 30X36 HEAVY ABSORB (UNDERPADS AND DIAPERS) ×1 IMPLANT

## 2023-05-22 NOTE — Anesthesia Procedure Notes (Signed)
Procedure Name: MAC Date/Time: 05/22/2023 7:38 AM  Performed by: Francie Massing, CRNAPre-anesthesia Checklist: Patient identified, Emergency Drugs available, Suction available, Patient being monitored and Timeout performed Oxygen Delivery Method: Simple face mask

## 2023-05-22 NOTE — Progress Notes (Signed)
Patient had fiducial's and spaceOAR placement today, 9/27 and is tentatively set up for CT Simulation on 10/4.    RN spoke with patient and he does plan to keep appointment with [facility] on 10/1.  Pt agreeable for me to follow up on 10/3 prior to simulation on 10/4 to ensure he would like to proceed with daily radiation here in [place].

## 2023-05-22 NOTE — Discharge Instructions (Addendum)
Activity:  You are encouraged to ambulate frequently (about every hour during waking hours) to help prevent blood clots from forming in your legs or lungs.  However, you should not engage in any heavy lifting (> 10-15 lbs), strenuous activity, or straining.  Diet: You should advance your diet as instructed by your physician.  It will be normal to have some bloating, nausea, and abdominal discomfort intermittently.  What to call us about: You should call the office [phone]) if you develop fever > 101 or develop persistent vomiting. Activity:  You are encouraged to ambulate frequently (about every hour during waking hours) to help prevent blood clots from forming in your legs or lungs.  However, you should not engage in any heavy lifting (> 10-15 lbs), strenuous activity, or straining.        Post Anesthesia Home Care Instructions  Activity: Get plenty of rest for the remainder of the day. A responsible individual must stay with you for 24 hours following the procedure.  For the next 24 hours, DO NOT: -Drive a car -Advertising copywriter -Drink alcoholic beverages -Take any medication unless instructed by your physician -Make any legal decisions or sign important papers.  Meals: Start with liquid foods such as gelatin or soup. Progress to regular foods as tolerated. Avoid greasy, spicy, heavy foods. If nausea and/or vomiting occur, drink only clear liquids until the nausea and/or vomiting subsides. Call your physician if vomiting continues.  Special Instructions/Symptoms: Your throat may feel dry or sore from the anesthesia or the breathing tube placed in your throat during surgery. If this causes discomfort, gargle with warm salt water. The discomfort should disappear within 24 hours.

## 2023-05-22 NOTE — Op Note (Signed)
Operative Note  Preoperative diagnosis:  1.  Clinically localized adenocarcinoma of the prostate  Postoperative diagnosis: 1.  Clinically localized adenocarcinoma of the prostate  Procedure(s): 1. Placement of fiducial markers into prostate 2. Insertion of SpaceOAR hydrogel   Surgeon: Jettie Pagan, MD  Assistants:  None  Anesthesia:  General  Complications:  None  EBL:  Minimal  Specimens: 1. None  Drains/Catheters: 1.  None  Indication:  Willie Farrell is a 76 y.o. male with clinically localized prostate cancer. After discussing management options for treatment, he elected to proceed with radiotherapy. He presents today for the above procedures. The potential risks, complications, alternative options, and expected recovery course have been discussed in detail with the patient and he has provided informed consent to proceed.  Description of procedure: The patient was administered preoperative antibiotics, placed in the dorsal lithotomy position, and prepped and draped in the usual sterile fashion. Next, transrectal ultrasonography was utilized to visualize the prostate. Three gold fiducial markers were then placed into the prostate via transperineal needles under ultrasound guidance at the right apex, right base, and left mid gland under direct ultrasound guidance. A site in the midline was then selected on the perineum for placement of an 18 g needle with saline. The needle was advanced above the rectum and below Denonvillier's fascia to the mid gland and confirmed to be in the midline on transverse imaging. One cc of saline was injected confirming appropriate expansion of this space. A total of 5 cc of saline was then injected to open the space further bilaterally. The saline syringe was then removed and the SpaceOAR hydrogel was injected with good distribution bilaterally. He tolerated the procedure well and without complications. He was given a voiding trial prior to discharge  from the PACU.  Matt R. Brantlee Hinde MD [facility]  Pager: [phone]

## 2023-05-22 NOTE — Transfer of Care (Signed)
Immediate Anesthesia Transfer of Care Note  Patient: Willie Farrell  Procedure(s) Performed: Procedure(s) (LRB): GOLD SEED IMPLANT (N/A) SPACE OAR INSTILLATION (N/A)  Patient Location: PACU  Anesthesia Type: General  Level of Consciousness: awake, oriented, sedated and patient cooperative  Airway & Oxygen Therapy: Patient Spontanous Breathing and Patient connected to face mask oxygen  Post-op Assessment: Report given to PACU RN and Post -op Vital signs reviewed and stable  Post vital signs: Reviewed and stable  Complications: No apparent anesthesia complications Last Vitals:  Vitals Value Taken Time  BP    Temp    Pulse    Resp    SpO2      Last Pain:  Vitals:   05/22/23 0600  TempSrc: Oral  PainSc: 0-No pain      Patients Stated Pain Goal: 5 (05/22/23 0600)  Complications: No notable events documented.

## 2023-05-22 NOTE — H&P (Signed)
Visit Report     05/14/2023    CC/HPI: CC: Prostate Cancer   PCP: Dr. 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 He underwent an MRI of the prostate on 01/23/21 indicated a PI-RADS 4 lesion at the left apex/mid gland. An MR/US biopsy of the prostate on 02/19/21 indicated 6 out of 16 biopsy cores positive for Gleason 3+3=6 adenocarcinoma with all targeted biopsies negative for malignancy. He was initially seen in the prostate MDC in July 2022 and elected to proceed with active surveillance management. His PSA increased to 7.41 in February 2024 and an MRI of the prostate on 11/10/22 revealed a stable 8 mm PI-RADS 4 lesion at the left apex/mid but a new 1.5 cm PI-RADS 5 lesion in the right mid/base. In addition there was a new 6 mm left mid PI-RADS 4 lesion and a 1.4 cm PI-RADS 4 lesion of the right anterior prostate. An MR/US fusion biopsy was then performed on 12/22/22 which confirmed upgraded Gleason 3+4=7 adenocarcinoma in 14 of 28 biopsy cores.   Family history: None.   Imaging studies: None.   PMH: He has a history of CAD, depression, GERD, and hyperlipidemia. He sees Dr. Jacinto Halim.  PSH: Appendectomy.   TNM stage: cT1c Nx Mx  PSA: 7.41  Gleason score: 3+4=7 (GG 2)  Biopsy (12/22/22): 14/28 cores positive  Left: L mid (20%, 3+4=7), L lateral mid (5%, 3+3=6)  Right: R apex (5%, 3+3=6), R mid (60%, 3+4=7), R base (50%, 3+3=6), R lateral base (40%, 3+3=6)  ROI -1: Benign  ROI-2: 1/4 cores (5%, 3+3=6)  ROI-3: 3/4 cores (10%, 3+4=7), (5%, 3+4=7), (10%, 3+4=7)  ROI-4: 4/4 cores (60%, 3+4=7), (5%, 3+4=7), (60%, 3+4=7), (10%, 3+4=7)  Prostate volume: 41.2 cc  PSAD: 0.18    Urinary function: IPSS is  18. He did try Gemtesa and noticed 30% improvement especially with urgency but found it too expensive ($600 per month).  Erectile function: SHIM score is 3. He does have severe erectile dysfunction and this is a low priority.   05/14/2023:  Patient presents for presurgical clearance on upcoming fiducial marker placement with SpaceOAR on 9/27. He is on no medications by urology. Since last seen, patient endorses no changes to baseline voiding function nor overall medical history. He has not undergone any interim procedures, been given any new diagnoses, nor initiated new medications. Today, patient denies irritative symptoms, suprapubic discomfort, gross hematuria, flank pain, fever/chills, nausea/vomiting.     ALLERGIES: nkda    MEDICATIONS: Aspirin 81 mg tablet,chewable  Crestor 20 mg tablet  Diazepam 10 mg tablet Take 10 mg 30-60 minutes prior to your procedure  Levofloxacin 750 mg tablet Please take one tablet the morning of your biopsy.     GU PSH: Cystoscopy Ureteroscopy - 2020 Prostate Needle Biopsy - 12/22/2022, 2022     NON-GU PSH: Appendectomy Remove Adenoids Surgical Pathology, Gross And Microscopic Examination For Prostate Needle - 12/22/2022, 2022 Tonsillectomy     GU PMH: BPH w/LUTS - 02/06/2023, - 2023, - 2022, - 2022 Prostate Cancer - 02/06/2023, - 12/22/2022, - 05/21/2022, - 2023, - 2022 Urinary Urgency - 02/06/2023, - 05/21/2022, - 2023, - 2022, - 2022      PMH Notes:  1) Prostate cancer: He presented to me in May 2022 at age 61 and his PSA was noted to be elevated at 6.1. An MRI of the prostate in June 2022 indicated a left mid/apical PI-RADS 4 lesion. He underwent an MR/US biopsy of the prostate on 02/19/21 indicated 6 out of 12 biopsy cores positive for Gleason 3+3=6 adenocarcinoma. The targeted biopsies were negative for malignancy. He was counseled in the Prostate MDC in July 2022 and elected active surveillance. Oncotype Dx testing was performed and his GPS was 23  indicating a 77% chance of having low risk disease on RP.   Initial diagnosis: June 2022  TNM stage: cT1c Nx Mx  PSA at diagnosis: 6.1  Gleason score: 3+3=6 (GG 1)  Biopsy (02/19/21): 6/16 cores positive  Left: L lateral apex (30%, 3+3=6), L apex (30%, 3+3=6), L lateral mid (10%, 3+3=6), L mid (10%, 3+3=6)  Right: R mid (10%, 3+3=6), R lateral base (20%, 3+3=6)  Targeted: Benign  Prostate volume: 31.2 cc  PSAD: 0.20   Surveillance:  Mar 2024: MRI - 8 mm PR 4 lesion of left anterior, 1.5 cm PR 5 lesion of right mid/base, 6 mm PR 4 lesion of left mid/apex, and 1.4 cm PR 4 lesion of right anterior transition zone, 1.5 cm of right sided prostatic capsular contact   2) BPH/LUTS: His baseline symptoms included urgency, frequency, sense of incomplete emptying, and nocturia 4 times per night. Baseline IPSS was 18.   Prior treatment: Alpha blockers (ineffective), Myrbetriq (only 10% improvement and not worth the cost)   3) Abnormal cytology: He was noted to have a suspicious voided cytology in January 2020. The reason for this test is unclear. He had a repeat cytology that was also suspicious and was taken to the OR by Dr. Darvin Neighbours and underwent cystoscopy with bilateral retrograde pyelography, ureteroscopy, and selective cytologies that were negative for malignancy. Left ureteroscopy was performed due to a questionable filling defect and was also normal.      NON-GU PMH: Actinic keratosis Coronary Artery Disease Depression GERD Hypercholesterolemia Pain in left ankle and joints of left foot Unspecified abnormalities of gait and mobility    Immunizations: None   FAMILY HISTORY: 3 Son's - Runs in Family Myocardial Infarction - Father   SOCIAL HISTORY: Marital Status: Married Ethnicity: Not Hispanic Or Latino; Race: White Current Smoking Status: Patient does not smoke anymore. Has not smoked since 11/19/1968.   Tobacco Use Assessment Completed: Used Tobacco in last 30 days? Does drink.   Drinks 2 caffeinated drinks per day.    VITAL SIGNS:      05/14/2023 08:04 AM  Weight 225 lb / 102.06 kg  Height 2 in / 5.08 cm  BP 114/70 mmHg  Pulse 79 /min  Temperature 97.1 F / 36.1 C  BMI 39,543.8 kg/m   MULTI-SYSTEM PHYSICAL EXAMINATION:    Constitutional: Well-nourished. No physical deformities. Normally developed. Good grooming.  Respiratory: No labored breathing, no use of accessory muscles. Lungs clear to auscultation bilaterally. No wheezing, rales.  Cardiovascular: Normal temperature, normal extremity pulses, no swelling, no varicosities. Regular rate and rhythm. No murmurs, gallops.  Skin: No paleness, no jaundice, no cyanosis. No lesion, no ulcer, no rash.  Neurologic / Psychiatric: Oriented to time, oriented to place, oriented to person. No depression, no anxiety, no agitation.  Gastrointestinal: No mass, no tenderness, no rigidity, non obese abdomen.     Complexity of Data:  Source Of History:  Patient, Family/Caregiver, Medical Record Summary  Records Review:  Previous Doctor Records, Previous Patient Records  Urine Test Review:   Urinalysis   10/09/22 05/21/22 10/04/21  PSA  Total PSA 7.41 ng/ml 6.83 ng/mL 5.49 ng/ml    05/14/23  Urinalysis  Urine Appearance Clear   Urine Color Yellow   Urine Glucose Neg mg/dL  Urine Bilirubin Neg mg/dL  Urine Ketones Neg mg/dL  Urine Specific Gravity 1.025   Urine Blood 1+ ery/uL  Urine pH 5.5   Urine Protein Trace mg/dL  Urine Urobilinogen 0.2 mg/dL  Urine Nitrites Neg   Urine Leukocyte Esterase Neg leu/uL  Urine WBC/hpf NS (Not Seen)   Urine RBC/hpf 3 - 10/hpf   Urine Epithelial Cells 6 - 10/hpf   Urine Bacteria Rare (0-9/hpf)   Urine Mucous Present   Urine Yeast NS (Not Seen)   Urine Trichomonas Not Present   Urine Cystals NS (Not Seen)   Urine Casts Hyaline   Urine Sperm Not Present    PROCEDURES:          Urinalysis w/Scope Dipstick Dipstick Cont'd Micro  Color: Yellow Bilirubin: Neg mg/dL WBC/hpf:  NS (Not Seen)  Appearance: Clear Ketones: Neg mg/dL RBC/hpf: 3 - 16/XWR  Specific Gravity: 1.025 Blood: 1+ ery/uL Bacteria: Rare (0-9/hpf)  pH: 5.5 Protein: Trace mg/dL Cystals: NS (Not Seen)  Glucose: Neg mg/dL Urobilinogen: 0.2 mg/dL Casts: Hyaline    Nitrites: Neg Trichomonas: Not Present    Leukocyte Esterase: Neg leu/uL Mucous: Present      Epithelial Cells: 6 - 10/hpf      Yeast: NS (Not Seen)      Sperm: Not Present    ASSESSMENT:      ICD-10 Details  1 GU:   Prostate Cancer - C61 Chronic, Threat to Bodily Function   PLAN:           Orders Labs Urine Culture          Schedule Return Visit/Planned Activity: Keep Scheduled Appointment             Note: Fiducial marker placement with [employer] on 9/27.          Document Letter(s):  Created for Patient: Clinical Summary         Notes:   Today, UA WNL. We will send for presurgical urine culture clearance, and follow-up with patient as appropriate.   We reviewed upcoming procedure in detail with patient and his wife, and answered all his questions to the best of our abilities. Patient did inquire about a PSA prior to procedure, to obtain maximal value prior to therapy. We advised patient that we are happy to have a PSA drawn on him today, though it will not change course of management. Patient will return next week should he decide to proceed with request for PSA.   Maintain upcoming surgical appointment on 9/27. Patient knows to inform this office of any changes to baseline medical history or voiding function in the interim. Patient voiced understanding and is amenable to this plan.   Urology Preoperative H&P   Chief Complaint: Localized prostate cancer  History of Present Illness: Willie Farrell is a 77 y.o. male with localized prostate cancer here for fiducial markers and [employer]. Denies fevers, chills, dysuria. Preop Ucx negative.   Past Medical History:  Diagnosis Date   Anxiety    Benign localized prostatic  hyperplasia with lower urinary tract symptoms (LUTS)    CAD (coronary artery disease)    cardiologist--- dr Jacinto Halim;   Gait disorder    neruologist--- dr Frances Furbish  History of adenomatous polyp of colon    History of basal cell carcinoma (BCC) excision    08/ 2019  nose   HOH (hard of hearing)    per pt has hearing aid but does not wear   Hypercholesteremia    Malignant neoplasm prostate [facility]) 01/2021   urologist--- dr borden/  radiation onologist--- dr Kathrynn Running;  dx 06/ 2022, gleason 3+3 active surveillance ;  last fusion bx 04/ 2024 gleason 3+4   Vitamin D deficiency    Wears glasses     Past Surgical History:  Procedure Laterality Date   APPENDECTOMY  1957   COLONOSCOPY  03/2023   [facility]   CYSTOSCOPY WITH RETROGRADE PYELOGRAM, URETEROSCOPY AND STENT PLACEMENT  10/05/2018   @[email or web address]  by dr r. davis;  bilateral RTG/  left ureteroscopy/  bilateral renal pelvic washings /  left ureteral stent placement   TONSILLECTOMY AND ADENOIDECTOMY  1952    Allergies: No Known Allergies  Family History  Problem Relation Age of Onset   Pneumonia Mother    Diabetes Father    Heart attack Father 57   Cancer Paternal Aunt        gynecological cancer   Pancreatic cancer Maternal Grandmother    Bladder Cancer Maternal Grandfather     Social History:  reports that he quit smoking about 54 years ago. His smoking use included cigarettes. He started smoking about 56 years ago. He has a 1 pack-year smoking history. He has never used smokeless tobacco. He reports that he does not currently use alcohol. He reports that he does not use drugs.  ROS: A complete review of systems was performed.  All systems are negative except for pertinent findings as noted.  Physical Exam:  Vital signs in last 24 hours: Temp:  [97.7 F (36.5 C)] 97.7 F (36.5 C) (09/27 0600) Pulse Rate:  [73] 73 (09/27 0600) Resp:  [18] 18 (09/27 0600) BP: (120)/(80) 120/80 (09/27 0600) SpO2:  [92 %] 92 % (09/27 0600) Weight:   [100.6 kg] 100.6 kg (09/27 0600) Constitutional:  Alert and oriented, No acute distress Cardiovascular: Regular rate and rhythm Respiratory: Normal respiratory effort, Lungs clear bilaterally GI: Abdomen is soft, nontender, nondistended, no abdominal masses GU: No CVA tenderness Lymphatic: No lymphadenopathy Neurologic: Grossly intact, no focal deficits Psychiatric: Normal mood and affect  Laboratory Data:  No results for input(s): "WBC", "HGB", "HCT", "PLT" in the last 72 hours.  No results for input(s): "NA", "K", "CL", "GLUCOSE", "BUN", "CALCIUM", "CREATININE" in the last 72 hours.  Invalid input(s): "CO3"   No results found for this or any previous visit (from the past 24 hour(s)). No results found for this or any previous visit (from the past 240 hour(s)).  Renal Function: No results for input(s): "CREATININE" in the last 168 hours. CrCl cannot be calculated (Patient's most recent lab result is older than the maximum 21 days allowed.).  Radiologic Imaging: No results found.  I independently reviewed the above imaging studies.  Assessment and Plan Willie Farrell is a 77 y.o. male with localized prostate cancer here for fiducial markers and [employer].  Willie R. Navjot Pilgrim MD 05/22/2023, 7:28 AM  [facility] Pager: [phone]): [phone]

## 2023-05-22 NOTE — Anesthesia Preprocedure Evaluation (Addendum)
Anesthesia Evaluation  Patient identified by MRN, date of birth, ID band Patient awake    Reviewed: Allergy & Precautions, NPO status , Patient's Chart, lab work & pertinent test results  Airway Mallampati: II  TM Distance: >3 FB Neck ROM: Full    Dental  (+) Dental Advisory Given, Teeth Intact   Pulmonary former smoker   Pulmonary exam normal breath sounds clear to auscultation       Cardiovascular + CAD  Normal cardiovascular exam Rhythm:Regular Rate:Normal  Exercise treadmill stress test 03/21/2022: Exercise treadmill stress test performed using Bruce protocol.  Patient reached 8.2 METS, and 90% of age predicted maximum heart rate.  Exercise capacity was good.  No chest pain reported.  Normal heart rate and hemodynamic response. Stress EKG revealed no ischemic changes. Low risk study.   Lexiscan Tetrofosmin stress test 12/03/2020: Lexiscan nuclear stress test performed using 1-day protocol. SPECT images show small sized, mild intensity perfusion defect on rest and stress images, with no significant reversibility. In absence of wall motion abnormality, the changes likely suggest tissue attenuation, but ischemia cannot be excluded. Clinical correlation recommended. Stress LVEF 55%. Low risk study.   Echocardiogram 12/04/2020:  Normal LV systolic function with visual EF 55-60%. Left ventricle cavity  is normal in size. Normal global wall motion. Normal diastolic filling  pattern, normal LAP.  Mild tricuspid regurgitation. No evidence of pulmonary hypertension.  Compared to prior study dated 02/21/2016: no significant change.     Neuro/Psych  PSYCHIATRIC DISORDERS Anxiety     negative neurological ROS     GI/Hepatic negative GI ROS, Neg liver ROS,,,  Endo/Other  negative endocrine ROS    Renal/GU negative Renal ROS     Musculoskeletal negative musculoskeletal ROS (+)    Abdominal   Peds  Hematology negative  hematology ROS (+)   Anesthesia Other Findings   Reproductive/Obstetrics                              Anesthesia Physical Anesthesia Plan  ASA: 3  Anesthesia Plan: MAC   Post-op Pain Management: Tylenol PO (pre-op)*   Induction: Intravenous  PONV Risk Score and Plan: 1 and Ondansetron, Dexamethasone, Treatment may vary due to age or medical condition, Propofol infusion and TIVA  Airway Management Planned: Natural Airway  Additional Equipment:   Intra-op Plan:   Post-operative Plan:   Informed Consent: I have reviewed the patients History and Physical, chart, labs and discussed the procedure including the risks, benefits and alternatives for the proposed anesthesia with the patient or authorized representative who has indicated his/her understanding and acceptance.     Dental advisory given  Plan Discussed with: CRNA  Anesthesia Plan Comments:         Anesthesia Quick Evaluation

## 2023-05-25 ENCOUNTER — Encounter (HOSPITAL_BASED_OUTPATIENT_CLINIC_OR_DEPARTMENT_OTHER): Payer: Self-pay | Admitting: Urology

## 2023-05-25 NOTE — Anesthesia Postprocedure Evaluation (Signed)
Anesthesia Post Note  Patient: Willie Farrell  Procedure(s) Performed: GOLD SEED IMPLANT (Prostate) SPACE OAR INSTILLATION (Prostate)     Patient location during evaluation: PACU Anesthesia Type: MAC Level of consciousness: awake and alert Pain management: pain level controlled Vital Signs Assessment: post-procedure vital signs reviewed and stable Respiratory status: spontaneous breathing Cardiovascular status: stable Anesthetic complications: no   No notable events documented.  Last Vitals:  Vitals:   05/22/23 0830 05/22/23 0941  BP: 115/73 (!) 126/90  Pulse: (!) 43 (!) 49  Resp: 16 16  Temp:  (!) 36.2 C  SpO2: 94% 96%    Last Pain:  Vitals:   05/22/23 0941  TempSrc: Oral  PainSc: 0-No pain                 Lewie Loron

## 2023-05-26 DIAGNOSIS — C61 Malignant neoplasm of prostate: Secondary | ICD-10-CM | POA: Diagnosis not present

## 2023-05-26 DIAGNOSIS — R399 Unspecified symptoms and signs involving the genitourinary system: Secondary | ICD-10-CM | POA: Diagnosis not present

## 2023-05-27 ENCOUNTER — Telehealth: Payer: Self-pay | Admitting: *Deleted

## 2023-05-27 NOTE — Telephone Encounter (Signed)
Called patient to remind of sim appt. for 05-29-23- arrival time- 9:45 am @ [facility], informed patient to arrive with a full bladder, spoke with patient's wife- Rosey Bath and she is aware of this appt. and the instructions

## 2023-05-28 NOTE — Progress Notes (Signed)
Patient has confirmed he will proceed with treatment recommendations of 5.5 weeks of daily radiation.  Patient is scheduled for CT Simulation on 10/4.  All questions answered, no additional needs at this time.   Plan of care in progress.

## 2023-05-29 ENCOUNTER — Ambulatory Visit
Admission: RE | Admit: 2023-05-29 | Discharge: 2023-05-29 | Disposition: A | Discharge status: Home / Self Care | Payer: Medicare Other | Source: Ambulatory Visit | Attending: Radiation Oncology | Admitting: Radiation Oncology

## 2023-05-29 DIAGNOSIS — Z191 Hormone sensitive malignancy status: Secondary | ICD-10-CM | POA: Diagnosis not present

## 2023-05-29 DIAGNOSIS — C61 Malignant neoplasm of prostate: Secondary | ICD-10-CM | POA: Diagnosis not present

## 2023-05-29 DIAGNOSIS — Z510 Encounter for antineoplastic radiation therapy: Secondary | ICD-10-CM | POA: Diagnosis not present

## 2023-05-29 NOTE — Progress Notes (Signed)
Radiation Oncology         [phone]) [phone] ________________________________  Name: Ness Vanessen MRN: [id number]  Date: 05/29/2023  DOB: 1945-10-09  SIMULATION AND TREATMENT PLANNING NOTE    ICD-10-CM   1. Prostate cancer [facility])  C61       DIAGNOSIS:  77 y.o. gentleman with stage T1c adenocarcinoma of the prostate with a Gleason's score of 3+4 and a PSA of 7.41  NARRATIVE:  The patient was brought to the CT Simulation planning suite.  Identity was confirmed.  All relevant records and images related to the planned course of therapy were reviewed.  The patient freely provided informed written consent to proceed with treatment after reviewing the details related to the planned course of therapy. The consent form was witnessed and verified by the simulation staff.  Then, the patient was set-up in a stable reproducible supine position for radiation therapy.  A vacuum lock pillow device was custom fabricated to position his legs in a reproducible immobilized position.  Then, supervised the performance of a urethrogram under sterile conditions to identify the prostatic apex.  CT images were obtained.  Surface markings were placed.  The CT images were loaded into the planning software.  Then the prostate target and avoidance structures including the rectum, bladder, bowel and hips were contoured.  Treatment planning then occurred.  The radiation prescription was entered and confirmed.  A total of one complex treatment devices was fabricated. I have requested : Intensity Modulated Radiotherapy (IMRT) is medically necessary for this case for the following reason:  Rectal sparing.  I have requested daily cone beam CT volumetric image gudiance to track gold fiducial posiitoning along with bladder and rectal filling, this is medically necessary to assure accurate positioning of high dose radiation.  PLAN:  The patient will receive 70 Gy in 28 fractions.  ________________________________  Artist Pais  Kathrynn Running, M.D.

## 2023-06-08 ENCOUNTER — Ambulatory Visit (INDEPENDENT_AMBULATORY_CARE_PROVIDER_SITE_OTHER): Payer: Medicare Other | Admitting: Neurology

## 2023-06-08 ENCOUNTER — Encounter: Payer: Self-pay | Admitting: Neurology

## 2023-06-08 ENCOUNTER — Ambulatory Visit: Payer: Medicare Other

## 2023-06-08 VITALS — BP 95/58 | HR 69 | Ht 72.0 in | Wt 218.0 lb

## 2023-06-08 DIAGNOSIS — R269 Unspecified abnormalities of gait and mobility: Secondary | ICD-10-CM | POA: Diagnosis not present

## 2023-06-08 DIAGNOSIS — Z510 Encounter for antineoplastic radiation therapy: Secondary | ICD-10-CM | POA: Diagnosis not present

## 2023-06-08 DIAGNOSIS — C61 Malignant neoplasm of prostate: Secondary | ICD-10-CM | POA: Diagnosis not present

## 2023-06-08 DIAGNOSIS — Z191 Hormone sensitive malignancy status: Secondary | ICD-10-CM | POA: Diagnosis not present

## 2023-06-08 DIAGNOSIS — R413 Other amnesia: Secondary | ICD-10-CM | POA: Diagnosis not present

## 2023-06-08 NOTE — Progress Notes (Signed)
Subjective:    Patient ID: Willie Farrell is a 77 y.o. male.  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  The patient is unaccompanied today.  I saw him on 02/02/2023 at the request of his primary care physician, at which time the patient reported worsening gait, balance issues, and also difficulty with short-term memory including forgetfulness.  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  History and examination were not classic for Parkinson's disease.  He was advised to proceed with a brain MRI and neuropsychological evaluation with a licensed neuropsychologist.  I made a referral to Dr. Kieth Brightly.  He was also advised to proceed with blood work.  Blood tests showed normal TSH, normal chemistry panel and low normal vitamin B12, normal folate.  He was advised of test results by phone call.  He had a brain MRI with and without contrast on 02/10/2023 and I reviewed the results:  IMPRESSION:    MRI brain (with and without) demonstrating: - Mild atrophy and mild periventricular subcortical foci of nonspecific gliosis. - No acute findings.  Today, 06/08/2023: He reports that his balance and gait are stable.  He has had more forgetfulness he feels.  He drives and still does his ADLs without any concerns, stays active mentally and physically.  He feels forgetful, sometimes has difficulty with word retrieval, seems easily distractible and has difficulty with multitasking.  He has not fallen.  He has never been good with names.  He does a lot of repairs and remodeling around the house.  His mom lived to be 102 and had sharp memory till the end.  His dad died in his late 61s and had some memory loss but it was not  labeled Alzheimer's dementia.  Patient is scheduled to start radiation treatment for his prostate cancer this week.  He is scheduled to see Dr. Kieth Brightly in April 2025.  He has undergone gold seed implants in preparation for his XRT on 05/22/2023. He tries to hydrate well with water but admits that his fluid of choice is sweet tea.  He drinks about half a gallon per day.  The patient's allergies, current medications, family history, past medical history, past social history, past surgical history and problem list were reviewed and updated as appropriate.   Previously:   02/02/2023: (He) reports worsening gait difficulty, some instability, some balance issues, no recent falls.  He reports that he has a longstanding history of walking with a shuffle.  He feels that he has walked with a shuffle for over 40 years back in the past few years this has become worse.  He has noticed some changes in his balance, is more mindful of balance issues, sometimes feels off balance when he goes downstairs and has to hold on.  He has not actually fallen thankfully.  He does not use a walking aid.  He denies any pain in the back or major joints.  He does not have much in the way of difficulty with fine motor skills or tremors.  He has noticed in the past few years difficulty with his short-term memory.  He has been more forgetful.  He retired about 5 years ago and even then felt that his memory was not as sharp.  His dad had  dementia, possibly Alzheimer's.  His paternal grandparents passed away when he was still a young child so he does not remember that much.  No issues with memory loss on mom side, mom herself lived to be 65 years old.  He was diagnosed with prostate cancer last year and had a biopsy done.  He had another biopsy this year and his PSA has crept up.  He is now planning to pursue treatment such as radiation, they have a case conference this week with urology, oncology and radiation oncology as I understand.      I reviewed your office note from 10/13/2022.  He had blood work through your office on 10/09/2022 and I reviewed the results: Total cholesterol was 126, HDL 61, LDL 40, PSA was elevated at 7.41.  CBC with differential and platelets showed borderline high hematocrit at 47.1, otherwise normal findings.  CMP showed BUN of 14, creatinine 0.95, sodium 141, potassium 4.4, alk phos 68, ALT 27, AST 20.   I had evaluated him several years ago for dizziness. He had a brain MRI without contrast on 03/05/2016 and I have reviewed the results: IMPRESSION: 1. No acute intracranial abnormality. 2. Findings of mild chronic microvascular disease. Otherwise unremarkable MRI of the brain for age.    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 He was seen at [facility] in the emergency room and had extensive workup in [place], [place]. At the time of the episode he was with his grandson. He had a head CT without contrast in the emergency room which was negative. He did have an episode of vomiting. This was before he went to the emergency room. Emergency room records were reviewed from 02/09/2016. He was noted to have sinus bradycardia, EKG showed incomplete right bundle branch block. Blood work showed normal CBC with differential, normal BMP, troponin negative, chest x-ray single view portable showed no acute cardiopulmonary disease. CT head without contrast on 02/09/2016 showed no evidence of an acute intracranial abnormality. He was felt to have near-syncope, possibly a hypoglycemic episode. Of note, blood sugar level was 124 at the time of checking in the emergency room.    He was discharged from the emergency room. He had another brief episode of dizziness upon standing up in church about 3 weeks later. This happened when he was  kneeling and stood up, symptoms lasted for less than a minute. He denies frank spinning sensation or swaying but does sometimes feels a little off when walking, feels like he may be veering to one side. Thankfully he has not fallen. He denies any headache, one-sided weakness, numbness, slurring of speech, droopy face, denies any sinister snoring, has never been told that he pauses in his breathing while asleep but wife has mentioned moaning while he is asleep. He has never had a sleep study.  Of note, he does not typically hydrate well with water. He likes to drink 2 large cups of coffee in the morning, 1 soda per day, usually 5 days a week, and sweet tea several glasses per day. He admits that he drinks sweet tea. His A1c he reports is less than 7. I reviewed your office note from 03/04/2016, which you kindly included. Vitals were stable. Exam was nonfocal. Of note, he did not see Dr. Jacinto Halim, but had an echocardiogram in his office on 02/21/2016 which showed left ventricle cavity of normal size, normal  global wall motion. Calculated EF 56%, trace mitral regurgitation, mild tricuspid regurgitation, no evidence of pulmonary hypertension. Bilateral carotid ultrasound at Dr. Verl Dicker office from 02/21/2016 showed no hemodynamically significant arterial disease in the internal carotid arteries bilaterally. Normal study without any significant plaque burden. Antegrade right vertebral artery flow. Antegrade left vertebral artery flow. Current medications include Flomax, Crestor, he stopped myrbetriq. He was advised to start a baby aspirin but has not started it yet.   His Past Medical History Is Significant For: Past Medical History:  Diagnosis Date   Anxiety    Benign localized prostatic hyperplasia with lower urinary tract symptoms (LUTS)    CAD (coronary artery disease)    cardiologist--- dr Jacinto Halim;   Gait disorder    neruologist--- dr Frances Furbish   History of adenomatous polyp of colon    History of basal cell  carcinoma (BCC) excision    08/ 2019  nose   HOH (hard of hearing)    per pt has hearing aid but does not wear   Hypercholesteremia    Malignant neoplasm prostate [facility]) 01/2021   urologist--- dr borden/  radiation onologist--- dr Kathrynn Running;  dx 06/ 2022, gleason 3+3 active surveillance ;  last fusion bx 04/ 2024 gleason 3+4   Vitamin D deficiency    Wears glasses     His Past Surgical History Is Significant For: Past Surgical History:  Procedure Laterality Date   APPENDECTOMY  1957   COLONOSCOPY  03/2023   [facility]   CYSTOSCOPY WITH RETROGRADE PYELOGRAM, URETEROSCOPY AND STENT PLACEMENT  10/05/2018   @[email or web address]  by dr r. davis;  bilateral RTG/  left ureteroscopy/  bilateral renal pelvic washings /  left ureteral stent placement   GOLD SEED IMPLANT N/A 05/22/2023   Procedure: GOLD SEED IMPLANT;  Surgeon: Jannifer Hick, MD;  Location: [facility];  Service: Urology;  Laterality: N/A;   SPACE OAR INSTILLATION N/A 05/22/2023   Procedure: SPACE OAR INSTILLATION;  Surgeon: Jannifer Hick, MD;  Location[facility];  Service: Urology;  Laterality: N/A;   TONSILLECTOMY AND ADENOIDECTOMY  1952    His Family History Is Significant For: Family History  Problem Relation Age of Onset   Pneumonia Mother    Diabetes Father    Heart attack Father 66   Cancer Paternal Aunt        gynecological cancer   Pancreatic cancer Maternal Grandmother    Bladder Cancer Maternal Grandfather     His Social History Is Significant For: Social History   Socioeconomic History   Marital status: Married    Spouse name: Kmari Halter   Number of children: 3   Years of education: MD   Highest education level: Not on file  Occupational History   Occupation:  [employer]  Tobacco Use   Smoking status: Former    Current packs/day: 0.00    Average packs/day: 0.5 packs/day for 2.0 years (1.0 ttl pk-yrs)    Types: Cigarettes    Start date: 11/20/1966    Quit date: 11/19/1968     Years since quitting: 54.5   Smokeless tobacco: Never   Tobacco comments:    05-14-2023  last smoked age 89s (only smoked in college)  Vaping Use   Vaping status: Never Used  Substance and Sexual Activity   Alcohol use: Not Currently    Comment: very rare   Drug use: Never   Sexual activity: Not on file  Other Topics Concern   Not on file  Social  History Narrative   Drinks 2 cups of coffee a day    Lives wife.     Retired MD Endo/ PCP Diabetes specilsit   Social Determinants of Corporate investment banker Strain: Not on file  Food Insecurity: No Food Insecurity (02/06/2023)   Hunger Vital Sign    Worried About Running Out of Food in the Last Year: Never true    Ran Out of Food in the Last Year: Never true  Transportation Needs: No Transportation Needs (02/06/2023)   PRAPARE - Administrator, Civil Service (Medical): No    Lack of Transportation (Non-Medical): No  Physical Activity: Not on file  Stress: Not on file  Social Connections: Not on file    His Allergies Are:  No Known Allergies:   His Current Medications Are:  Outpatient Encounter Medications as of 06/08/2023  Medication Sig   Alum Hydroxide-Mag Carbonate 160-105 MG CHEW Chew by mouth as needed.   ascorbic acid (VITAMIN C) 500 MG tablet Take 1 tablet by mouth daily.   aspirin EC 81 MG tablet Take 81 mg by mouth at bedtime. Swallow whole.   cholecalciferol (VITAMIN D3) 25 MCG (1000 UNIT) tablet Take 1,000 Units by mouth 3 (three) times a week.   rosuvastatin (CRESTOR) 20 MG tablet Take 1 tablet (20 mg total) by mouth daily. (Patient taking differently: Take 20 mg by mouth at bedtime.)   No facility-administered encounter medications on file as of 06/08/2023.  :  Review of Systems:  Out of a complete 14 point review of systems, all are reviewed and negative with the exception of these symptoms as listed below:  Review of Systems  Neurological:        Rm 5 alone Pt is well, reports his gait  is about the same as last visit.  He also repots his memory has worsen since last visit. He forgets where he is placing things or what he has went into another room to get.  He notes he has not seen neuropsychology yet.     Objective:  Neurological Exam  Physical Exam Physical Examination:   Vitals:   06/08/23 0947  BP: (!) 95/58  Pulse: 69    General Examination: The patient is a very pleasant 77 y.o. male in no acute distress. He appears well-developed and well-nourished and well groomed.   HEENT: Normocephalic, atraumatic, pupils are equal, round and reactive, tracking well-preserved, possibly mild facial masking but not very prominent, stable.  Mild bilateral cataracts noted.  No significant nuchal rigidity.  Airway examination reveals mild mouth dryness.  Tongue protrudes centrally and palate elevates symmetrically.  No carotid bruits.   Chest: Clear to auscultation without wheezing, rhonchi or crackles noted.   Heart: S1+S2+0, regular and normal without murmurs, rubs or gallops noted.   Abdomen: Soft, non-tender and non-distended.   Extremities: There is no pitting edema in the distal lower extremities bilaterally.    Skin: Warm and dry without trophic changes noted.    Musculoskeletal: exam reveals no obvious joint deformities.    Neurologically:  Mental status: The patient is awake, alert and oriented in all 4 spheres. His immediate and remote memory, attention, language skills and fund of knowledge are fairly well-preserved, he does have some word finding difficulty and some delay in responding at times, stable.  There is no evidence of aphasia, agnosia, apraxia or anomia. Speech is clear with normal prosody and enunciation. Thought process is linear. Mood is normal and affect is normal.  Cranial  nerves II - XII are as described above under HEENT exam.  Motor exam: Normal bulk, strength and tone is noted. There is no obvious action or resting tremor.  No significant  postural tremor. Fine motor skills and coordination: Overall mildly impaired fine motor skills but no lateralization, stable. Cerebellar testing: No dysmetria or intention tremor. There is no truncal or gait ataxia.  Sensory exam: intact to light touch in the upper and lower extremities.  Gait, station and balance: He stands without difficulty but with mild slowness.  Posture is age-appropriate to slightly stooped for age.  He walks with slightly decreased stride length and pace, no telltale shuffling, decreased arm swing bilaterally.   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  History and examination are not classic for idiopathic Parkinson's disease.  Blood tests in June 2024 showed normal TSH, normal chemistry panel and low normal vitamin B12, normal folate.  His brain MRI from 02/10/2023 showed mild atrophy and mild periventricular white matter changes.  He is scheduled to see neuropsychology in April next year.  We talked about the importance of maintaining a healthy lifestyle, staying active mentally and physically, and the importance of good nutrition and good hydration with water. We will plan a follow up after his cognitive evaluation. I answered all his questions today and he was in agreement. I spent 30 minutes in total face-to-face time and in reviewing records during pre-charting, more than 50% of which was spent in counseling and coordination of care, reviewing test results, reviewing medications and treatment regimen and/or in discussing or reviewing the diagnosis of gait disorder, memory loss, the prognosis and treatment options. Pertinent laboratory and imaging test results that were available  during this visit with the patient were reviewed by me and considered in my medical decision making (see chart for details).

## 2023-06-08 NOTE — Patient Instructions (Addendum)
It was nice to see you again today.  We will await your memory evaluation with Dr. Kieth Brightly early next year and plan to follow-up afterwards. Please increase water intake and reduce your sweet tea consumption.

## 2023-06-11 ENCOUNTER — Ambulatory Visit
Admission: RE | Admit: 2023-06-11 | Discharge: 2023-06-11 | Disposition: A | Discharge status: Home / Self Care | Payer: Medicare Other | Source: Ambulatory Visit | Attending: Radiation Oncology | Admitting: Radiation Oncology

## 2023-06-11 ENCOUNTER — Other Ambulatory Visit: Payer: Self-pay

## 2023-06-11 DIAGNOSIS — C61 Malignant neoplasm of prostate: Secondary | ICD-10-CM | POA: Diagnosis not present

## 2023-06-11 DIAGNOSIS — Z510 Encounter for antineoplastic radiation therapy: Secondary | ICD-10-CM | POA: Diagnosis not present

## 2023-06-11 DIAGNOSIS — Z191 Hormone sensitive malignancy status: Secondary | ICD-10-CM | POA: Diagnosis not present

## 2023-06-11 LAB — RAD ONC ARIA SESSION SUMMARY
Course Elapsed Days: 0
Plan Fractions Treated to Date: 1
Plan Prescribed Dose Per Fraction: 2.5 Gy
Plan Total Fractions Prescribed: 28
Plan Total Prescribed Dose: 70 Gy
Reference Point Dosage Given to Date: 2.5 Gy
Reference Point Session Dosage Given: 2.5 Gy
Session Number: 1

## 2023-06-11 NOTE — Progress Notes (Signed)
Pt here for patient teaching.  Pt given Radiation and You booklet and skin care instructions.  Reviewed areas of pertinence such as diarrhea, fatigue, hair loss, nausea and vomiting, sexual and fertility changes, skin changes, and urinary and bladder changes . Pt able to give teach back of have Imodium on hand and drink plenty of water,avoid applying anything to skin within 4 hours of treatment. Pt verbalizes understanding of information given and will contact nursing with any questions or concerns.     [email or web address]

## 2023-06-12 ENCOUNTER — Other Ambulatory Visit: Payer: Self-pay

## 2023-06-12 ENCOUNTER — Ambulatory Visit
Admission: RE | Admit: 2023-06-12 | Discharge: 2023-06-12 | Disposition: A | Discharge status: Home / Self Care | Payer: Medicare Other | Source: Ambulatory Visit | Attending: Radiation Oncology

## 2023-06-12 DIAGNOSIS — C61 Malignant neoplasm of prostate: Secondary | ICD-10-CM | POA: Diagnosis not present

## 2023-06-12 DIAGNOSIS — Z510 Encounter for antineoplastic radiation therapy: Secondary | ICD-10-CM | POA: Diagnosis not present

## 2023-06-12 DIAGNOSIS — Z191 Hormone sensitive malignancy status: Secondary | ICD-10-CM | POA: Diagnosis not present

## 2023-06-12 LAB — RAD ONC ARIA SESSION SUMMARY
Course Elapsed Days: 1
Plan Fractions Treated to Date: 2
Plan Prescribed Dose Per Fraction: 2.5 Gy
Plan Total Fractions Prescribed: 28
Plan Total Prescribed Dose: 70 Gy
Reference Point Dosage Given to Date: 5 Gy
Reference Point Session Dosage Given: 2.5 Gy
Session Number: 2

## 2023-06-15 ENCOUNTER — Other Ambulatory Visit: Payer: Self-pay

## 2023-06-15 ENCOUNTER — Ambulatory Visit
Admission: RE | Admit: 2023-06-15 | Discharge: 2023-06-15 | Disposition: A | Discharge status: Home / Self Care | Payer: Medicare Other | Source: Ambulatory Visit | Attending: Radiation Oncology

## 2023-06-15 DIAGNOSIS — Z191 Hormone sensitive malignancy status: Secondary | ICD-10-CM | POA: Diagnosis not present

## 2023-06-15 DIAGNOSIS — Z510 Encounter for antineoplastic radiation therapy: Secondary | ICD-10-CM | POA: Diagnosis not present

## 2023-06-15 DIAGNOSIS — C61 Malignant neoplasm of prostate: Secondary | ICD-10-CM | POA: Diagnosis not present

## 2023-06-15 LAB — RAD ONC ARIA SESSION SUMMARY
Course Elapsed Days: 4
Plan Fractions Treated to Date: 3
Plan Prescribed Dose Per Fraction: 2.5 Gy
Plan Total Fractions Prescribed: 28
Plan Total Prescribed Dose: 70 Gy
Reference Point Dosage Given to Date: 7.5 Gy
Reference Point Session Dosage Given: 2.5 Gy
Session Number: 3

## 2023-06-16 ENCOUNTER — Other Ambulatory Visit: Payer: Self-pay

## 2023-06-16 ENCOUNTER — Ambulatory Visit
Admission: RE | Admit: 2023-06-16 | Discharge: 2023-06-16 | Disposition: A | Discharge status: Home / Self Care | Payer: Medicare Other | Source: Ambulatory Visit | Attending: Radiation Oncology | Admitting: Radiation Oncology

## 2023-06-16 DIAGNOSIS — Z510 Encounter for antineoplastic radiation therapy: Secondary | ICD-10-CM | POA: Diagnosis not present

## 2023-06-16 DIAGNOSIS — Z191 Hormone sensitive malignancy status: Secondary | ICD-10-CM | POA: Diagnosis not present

## 2023-06-16 DIAGNOSIS — C61 Malignant neoplasm of prostate: Secondary | ICD-10-CM | POA: Diagnosis not present

## 2023-06-16 LAB — RAD ONC ARIA SESSION SUMMARY
Course Elapsed Days: 5
Plan Fractions Treated to Date: 4
Plan Prescribed Dose Per Fraction: 2.5 Gy
Plan Total Fractions Prescribed: 28
Plan Total Prescribed Dose: 70 Gy
Reference Point Dosage Given to Date: 10 Gy
Reference Point Session Dosage Given: 2.5 Gy
Session Number: 4

## 2023-06-17 ENCOUNTER — Ambulatory Visit: Admission: RE | Admit: 2023-06-17 | Payer: Medicare Other | Source: Ambulatory Visit

## 2023-06-17 ENCOUNTER — Other Ambulatory Visit: Payer: Self-pay

## 2023-06-18 ENCOUNTER — Ambulatory Visit: Payer: Medicare Other

## 2023-06-19 ENCOUNTER — Other Ambulatory Visit: Payer: Self-pay

## 2023-06-19 ENCOUNTER — Ambulatory Visit: Payer: Medicare Other

## 2023-06-19 ENCOUNTER — Ambulatory Visit
Admission: RE | Admit: 2023-06-19 | Discharge: 2023-06-19 | Disposition: A | Discharge status: Home / Self Care | Payer: Medicare Other | Source: Ambulatory Visit | Attending: Radiation Oncology

## 2023-06-19 DIAGNOSIS — Z510 Encounter for antineoplastic radiation therapy: Secondary | ICD-10-CM | POA: Diagnosis not present

## 2023-06-19 DIAGNOSIS — Z191 Hormone sensitive malignancy status: Secondary | ICD-10-CM | POA: Diagnosis not present

## 2023-06-19 DIAGNOSIS — C61 Malignant neoplasm of prostate: Secondary | ICD-10-CM | POA: Diagnosis not present

## 2023-06-19 LAB — RAD ONC ARIA SESSION SUMMARY
Course Elapsed Days: 8
Plan Fractions Treated to Date: 5
Plan Prescribed Dose Per Fraction: 2.5 Gy
Plan Total Fractions Prescribed: 28
Plan Total Prescribed Dose: 70 Gy
Reference Point Dosage Given to Date: 12.5 Gy
Reference Point Session Dosage Given: 2.5 Gy
Session Number: 5

## 2023-06-22 ENCOUNTER — Ambulatory Visit: Payer: Medicare Other

## 2023-06-22 ENCOUNTER — Other Ambulatory Visit: Payer: Self-pay

## 2023-06-22 ENCOUNTER — Ambulatory Visit
Admission: RE | Admit: 2023-06-22 | Discharge: 2023-06-22 | Disposition: A | Discharge status: Home / Self Care | Payer: Medicare Other | Source: Ambulatory Visit | Attending: Radiation Oncology

## 2023-06-22 DIAGNOSIS — C61 Malignant neoplasm of prostate: Secondary | ICD-10-CM | POA: Diagnosis not present

## 2023-06-22 DIAGNOSIS — Z191 Hormone sensitive malignancy status: Secondary | ICD-10-CM | POA: Diagnosis not present

## 2023-06-22 DIAGNOSIS — Z510 Encounter for antineoplastic radiation therapy: Secondary | ICD-10-CM | POA: Diagnosis not present

## 2023-06-22 LAB — RAD ONC ARIA SESSION SUMMARY
Course Elapsed Days: 11
Plan Fractions Treated to Date: 6
Plan Prescribed Dose Per Fraction: 2.5 Gy
Plan Total Fractions Prescribed: 28
Plan Total Prescribed Dose: 70 Gy
Reference Point Dosage Given to Date: 15 Gy
Reference Point Session Dosage Given: 2.5 Gy
Session Number: 6

## 2023-06-23 ENCOUNTER — Other Ambulatory Visit: Payer: Self-pay

## 2023-06-23 ENCOUNTER — Ambulatory Visit
Admission: RE | Admit: 2023-06-23 | Discharge: 2023-06-23 | Disposition: A | Discharge status: Home / Self Care | Payer: Medicare Other | Source: Ambulatory Visit | Attending: Radiation Oncology | Admitting: Radiation Oncology

## 2023-06-23 DIAGNOSIS — Z510 Encounter for antineoplastic radiation therapy: Secondary | ICD-10-CM | POA: Diagnosis not present

## 2023-06-23 DIAGNOSIS — Z191 Hormone sensitive malignancy status: Secondary | ICD-10-CM | POA: Diagnosis not present

## 2023-06-23 DIAGNOSIS — C61 Malignant neoplasm of prostate: Secondary | ICD-10-CM | POA: Diagnosis not present

## 2023-06-23 LAB — RAD ONC ARIA SESSION SUMMARY
Course Elapsed Days: 12
Plan Fractions Treated to Date: 7
Plan Prescribed Dose Per Fraction: 2.5 Gy
Plan Total Fractions Prescribed: 28
Plan Total Prescribed Dose: 70 Gy
Reference Point Dosage Given to Date: 17.5 Gy
Reference Point Session Dosage Given: 2.5 Gy
Session Number: 7

## 2023-06-24 ENCOUNTER — Ambulatory Visit
Admission: RE | Admit: 2023-06-24 | Discharge: 2023-06-24 | Disposition: A | Discharge status: Home / Self Care | Payer: Medicare Other | Source: Ambulatory Visit | Attending: Radiation Oncology

## 2023-06-24 ENCOUNTER — Other Ambulatory Visit: Payer: Self-pay

## 2023-06-24 DIAGNOSIS — Z191 Hormone sensitive malignancy status: Secondary | ICD-10-CM | POA: Diagnosis not present

## 2023-06-24 DIAGNOSIS — C61 Malignant neoplasm of prostate: Secondary | ICD-10-CM | POA: Diagnosis not present

## 2023-06-24 DIAGNOSIS — Z510 Encounter for antineoplastic radiation therapy: Secondary | ICD-10-CM | POA: Diagnosis not present

## 2023-06-24 LAB — RAD ONC ARIA SESSION SUMMARY
Course Elapsed Days: 13
Plan Fractions Treated to Date: 8
Plan Prescribed Dose Per Fraction: 2.5 Gy
Plan Total Fractions Prescribed: 28
Plan Total Prescribed Dose: 70 Gy
Reference Point Dosage Given to Date: 20 Gy
Reference Point Session Dosage Given: 2.5 Gy
Session Number: 8

## 2023-06-25 ENCOUNTER — Other Ambulatory Visit: Payer: Self-pay

## 2023-06-25 ENCOUNTER — Ambulatory Visit
Admission: RE | Admit: 2023-06-25 | Discharge: 2023-06-25 | Disposition: A | Discharge status: Home / Self Care | Payer: Medicare Other | Source: Ambulatory Visit | Attending: Radiation Oncology | Admitting: Radiation Oncology

## 2023-06-25 DIAGNOSIS — Z191 Hormone sensitive malignancy status: Secondary | ICD-10-CM | POA: Diagnosis not present

## 2023-06-25 DIAGNOSIS — Z510 Encounter for antineoplastic radiation therapy: Secondary | ICD-10-CM | POA: Diagnosis not present

## 2023-06-25 DIAGNOSIS — C61 Malignant neoplasm of prostate: Secondary | ICD-10-CM | POA: Diagnosis not present

## 2023-06-25 LAB — RAD ONC ARIA SESSION SUMMARY
Course Elapsed Days: 14
Plan Fractions Treated to Date: 9
Plan Prescribed Dose Per Fraction: 2.5 Gy
Plan Total Fractions Prescribed: 28
Plan Total Prescribed Dose: 70 Gy
Reference Point Dosage Given to Date: 22.5 Gy
Reference Point Session Dosage Given: 2.5 Gy
Session Number: 9

## 2023-06-26 ENCOUNTER — Ambulatory Visit
Admission: RE | Admit: 2023-06-26 | Discharge: 2023-06-26 | Disposition: A | Discharge status: Home / Self Care | Payer: Medicare Other | Source: Ambulatory Visit | Attending: Radiation Oncology | Admitting: Radiation Oncology

## 2023-06-26 ENCOUNTER — Other Ambulatory Visit: Payer: Self-pay

## 2023-06-26 ENCOUNTER — Ambulatory Visit
Admission: RE | Admit: 2023-06-26 | Discharge: 2023-06-26 | Disposition: A | Discharge status: Home / Self Care | Payer: Medicare Other | Source: Ambulatory Visit | Attending: Radiation Oncology

## 2023-06-26 DIAGNOSIS — Z510 Encounter for antineoplastic radiation therapy: Secondary | ICD-10-CM | POA: Insufficient documentation

## 2023-06-26 DIAGNOSIS — C61 Malignant neoplasm of prostate: Secondary | ICD-10-CM | POA: Insufficient documentation

## 2023-06-26 DIAGNOSIS — Z191 Hormone sensitive malignancy status: Secondary | ICD-10-CM | POA: Diagnosis not present

## 2023-06-26 LAB — RAD ONC ARIA SESSION SUMMARY
Course Elapsed Days: 15
Plan Fractions Treated to Date: 10
Plan Prescribed Dose Per Fraction: 2.5 Gy
Plan Total Fractions Prescribed: 28
Plan Total Prescribed Dose: 70 Gy
Reference Point Dosage Given to Date: 25 Gy
Reference Point Session Dosage Given: 2.5 Gy
Session Number: 10

## 2023-06-29 ENCOUNTER — Ambulatory Visit
Admission: RE | Admit: 2023-06-29 | Discharge: 2023-06-29 | Disposition: A | Discharge status: Home / Self Care | Payer: Medicare Other | Source: Ambulatory Visit | Attending: Radiation Oncology

## 2023-06-29 ENCOUNTER — Other Ambulatory Visit: Payer: Self-pay

## 2023-06-29 DIAGNOSIS — Z191 Hormone sensitive malignancy status: Secondary | ICD-10-CM | POA: Diagnosis not present

## 2023-06-29 DIAGNOSIS — C61 Malignant neoplasm of prostate: Secondary | ICD-10-CM | POA: Diagnosis not present

## 2023-06-29 DIAGNOSIS — Z510 Encounter for antineoplastic radiation therapy: Secondary | ICD-10-CM | POA: Diagnosis not present

## 2023-06-29 LAB — RAD ONC ARIA SESSION SUMMARY
Course Elapsed Days: 18
Plan Fractions Treated to Date: 11
Plan Prescribed Dose Per Fraction: 2.5 Gy
Plan Total Fractions Prescribed: 28
Plan Total Prescribed Dose: 70 Gy
Reference Point Dosage Given to Date: 27.5 Gy
Reference Point Session Dosage Given: 2.5 Gy
Session Number: 11

## 2023-06-30 ENCOUNTER — Ambulatory Visit
Admission: RE | Admit: 2023-06-30 | Discharge: 2023-06-30 | Disposition: A | Discharge status: Home / Self Care | Payer: Medicare Other | Source: Ambulatory Visit | Attending: Radiation Oncology | Admitting: Radiation Oncology

## 2023-06-30 ENCOUNTER — Other Ambulatory Visit: Payer: Self-pay

## 2023-06-30 DIAGNOSIS — Z191 Hormone sensitive malignancy status: Secondary | ICD-10-CM | POA: Diagnosis not present

## 2023-06-30 DIAGNOSIS — Z510 Encounter for antineoplastic radiation therapy: Secondary | ICD-10-CM | POA: Diagnosis not present

## 2023-06-30 DIAGNOSIS — C61 Malignant neoplasm of prostate: Secondary | ICD-10-CM | POA: Diagnosis not present

## 2023-06-30 LAB — RAD ONC ARIA SESSION SUMMARY
Course Elapsed Days: 19
Plan Fractions Treated to Date: 12
Plan Prescribed Dose Per Fraction: 2.5 Gy
Plan Total Fractions Prescribed: 28
Plan Total Prescribed Dose: 70 Gy
Reference Point Dosage Given to Date: 30 Gy
Reference Point Session Dosage Given: 2.5 Gy
Session Number: 12

## 2023-07-01 ENCOUNTER — Ambulatory Visit
Admission: RE | Admit: 2023-07-01 | Discharge: 2023-07-01 | Disposition: A | Discharge status: Home / Self Care | Payer: Medicare Other | Source: Ambulatory Visit | Attending: Radiation Oncology | Admitting: Radiation Oncology

## 2023-07-01 ENCOUNTER — Other Ambulatory Visit: Payer: Self-pay

## 2023-07-01 DIAGNOSIS — Z191 Hormone sensitive malignancy status: Secondary | ICD-10-CM | POA: Diagnosis not present

## 2023-07-01 DIAGNOSIS — Z510 Encounter for antineoplastic radiation therapy: Secondary | ICD-10-CM | POA: Diagnosis not present

## 2023-07-01 DIAGNOSIS — C61 Malignant neoplasm of prostate: Secondary | ICD-10-CM | POA: Diagnosis not present

## 2023-07-01 LAB — RAD ONC ARIA SESSION SUMMARY
Course Elapsed Days: 20
Plan Fractions Treated to Date: 13
Plan Prescribed Dose Per Fraction: 2.5 Gy
Plan Total Fractions Prescribed: 28
Plan Total Prescribed Dose: 70 Gy
Reference Point Dosage Given to Date: 32.5 Gy
Reference Point Session Dosage Given: 2.5 Gy
Session Number: 13

## 2023-07-02 ENCOUNTER — Ambulatory Visit
Admission: RE | Admit: 2023-07-02 | Discharge: 2023-07-02 | Disposition: A | Discharge status: Home / Self Care | Payer: Medicare Other | Source: Ambulatory Visit | Attending: Radiation Oncology | Admitting: Radiation Oncology

## 2023-07-02 ENCOUNTER — Other Ambulatory Visit: Payer: Self-pay

## 2023-07-02 ENCOUNTER — Ambulatory Visit: Payer: Medicare Other

## 2023-07-02 DIAGNOSIS — C61 Malignant neoplasm of prostate: Secondary | ICD-10-CM | POA: Diagnosis not present

## 2023-07-02 DIAGNOSIS — Z510 Encounter for antineoplastic radiation therapy: Secondary | ICD-10-CM | POA: Diagnosis not present

## 2023-07-02 DIAGNOSIS — Z191 Hormone sensitive malignancy status: Secondary | ICD-10-CM | POA: Diagnosis not present

## 2023-07-02 LAB — RAD ONC ARIA SESSION SUMMARY
Course Elapsed Days: 21
Plan Fractions Treated to Date: 14
Plan Prescribed Dose Per Fraction: 2.5 Gy
Plan Total Fractions Prescribed: 28
Plan Total Prescribed Dose: 70 Gy
Reference Point Dosage Given to Date: 35 Gy
Reference Point Session Dosage Given: 2.5 Gy
Session Number: 14

## 2023-07-03 ENCOUNTER — Telehealth: Payer: Self-pay

## 2023-07-03 ENCOUNTER — Other Ambulatory Visit: Payer: Self-pay

## 2023-07-03 ENCOUNTER — Other Ambulatory Visit: Payer: Self-pay | Admitting: Urology

## 2023-07-03 ENCOUNTER — Ambulatory Visit
Admission: RE | Admit: 2023-07-03 | Discharge: 2023-07-03 | Disposition: A | Discharge status: Home / Self Care | Payer: Medicare Other | Source: Ambulatory Visit | Attending: Radiation Oncology | Admitting: Radiation Oncology

## 2023-07-03 DIAGNOSIS — Z191 Hormone sensitive malignancy status: Secondary | ICD-10-CM | POA: Diagnosis not present

## 2023-07-03 DIAGNOSIS — C61 Malignant neoplasm of prostate: Secondary | ICD-10-CM | POA: Diagnosis not present

## 2023-07-03 DIAGNOSIS — Z510 Encounter for antineoplastic radiation therapy: Secondary | ICD-10-CM | POA: Diagnosis not present

## 2023-07-03 LAB — RAD ONC ARIA SESSION SUMMARY
Course Elapsed Days: 22
Plan Fractions Treated to Date: 15
Plan Prescribed Dose Per Fraction: 2.5 Gy
Plan Total Fractions Prescribed: 28
Plan Total Prescribed Dose: 70 Gy
Reference Point Dosage Given to Date: 37.5 Gy
Reference Point Session Dosage Given: 2.5 Gy
Session Number: 15

## 2023-07-03 MED ORDER — MIRABEGRON ER 50 MG PO TB24: 50.0000 mg | ORAL_TABLET | Freq: Every day | ORAL | 2 refills | Status: DC

## 2023-07-03 NOTE — Telephone Encounter (Signed)
[name] was seen yesterday for PUT visit with Dr. Kathrynn Running and denied pain, dysuria, hematuria, nausea, vomiting, and diarrhea.  Did report nocturia x 4-5.  He stopped by clinic on his way out want ed to report a change overnight of frequency/urgency, weak urine stream, bladder not completely emptying.  He not retaining per him but doesn't think its completely empty.He reports its not a UTI symptoms just worried about the slow weak stream and wanted to address this before weekend to avoid going to hospital.  He's not on any alpha blockers or anything for overactive bladder.  I think he could benefit from Flomax or Mybetriq. RN called patient per Marcello Fennel, PA-C follow up for earlier concerns.  Since patient BP is really low today she is hesitant to start an alpha blocker for fear that it will drop too low so she sent a Rx for Myrbetriq to his pharmacy to try over the weekend. Patient was advised that if he is unable to void, he should stop taking immediately and be evaluated with urology or ED. This is not an anticholinergic like some of the other medications for OAB so definitely not high risk for retention just making patient aware.

## 2023-07-06 ENCOUNTER — Other Ambulatory Visit: Payer: Self-pay

## 2023-07-06 ENCOUNTER — Ambulatory Visit
Admission: RE | Admit: 2023-07-06 | Discharge: 2023-07-06 | Disposition: A | Discharge status: Home / Self Care | Payer: Medicare Other | Source: Ambulatory Visit | Attending: Radiation Oncology

## 2023-07-06 ENCOUNTER — Ambulatory Visit: Payer: Medicare Other

## 2023-07-06 DIAGNOSIS — C61 Malignant neoplasm of prostate: Secondary | ICD-10-CM | POA: Diagnosis not present

## 2023-07-06 DIAGNOSIS — Z510 Encounter for antineoplastic radiation therapy: Secondary | ICD-10-CM | POA: Diagnosis not present

## 2023-07-06 DIAGNOSIS — Z191 Hormone sensitive malignancy status: Secondary | ICD-10-CM | POA: Diagnosis not present

## 2023-07-06 LAB — RAD ONC ARIA SESSION SUMMARY
Course Elapsed Days: 25
Plan Fractions Treated to Date: 16
Plan Prescribed Dose Per Fraction: 2.5 Gy
Plan Total Fractions Prescribed: 28
Plan Total Prescribed Dose: 70 Gy
Reference Point Dosage Given to Date: 40 Gy
Reference Point Session Dosage Given: 2.5 Gy
Session Number: 16

## 2023-07-07 ENCOUNTER — Ambulatory Visit
Admission: RE | Admit: 2023-07-07 | Discharge: 2023-07-07 | Disposition: A | Discharge status: Home / Self Care | Payer: Medicare Other | Source: Ambulatory Visit | Attending: Radiation Oncology

## 2023-07-07 ENCOUNTER — Other Ambulatory Visit: Payer: Self-pay

## 2023-07-07 DIAGNOSIS — C61 Malignant neoplasm of prostate: Secondary | ICD-10-CM | POA: Diagnosis not present

## 2023-07-07 DIAGNOSIS — Z191 Hormone sensitive malignancy status: Secondary | ICD-10-CM | POA: Diagnosis not present

## 2023-07-07 DIAGNOSIS — Z510 Encounter for antineoplastic radiation therapy: Secondary | ICD-10-CM | POA: Diagnosis not present

## 2023-07-07 LAB — RAD ONC ARIA SESSION SUMMARY
Course Elapsed Days: 26
Plan Fractions Treated to Date: 17
Plan Prescribed Dose Per Fraction: 2.5 Gy
Plan Total Fractions Prescribed: 28
Plan Total Prescribed Dose: 70 Gy
Reference Point Dosage Given to Date: 42.5 Gy
Reference Point Session Dosage Given: 2.5 Gy
Session Number: 17

## 2023-07-08 ENCOUNTER — Other Ambulatory Visit: Payer: Self-pay

## 2023-07-08 ENCOUNTER — Ambulatory Visit
Admission: RE | Admit: 2023-07-08 | Discharge: 2023-07-08 | Disposition: A | Discharge status: Home / Self Care | Payer: Medicare Other | Source: Ambulatory Visit | Attending: Radiation Oncology | Admitting: Radiation Oncology

## 2023-07-08 DIAGNOSIS — C61 Malignant neoplasm of prostate: Secondary | ICD-10-CM | POA: Diagnosis not present

## 2023-07-08 DIAGNOSIS — Z510 Encounter for antineoplastic radiation therapy: Secondary | ICD-10-CM | POA: Diagnosis not present

## 2023-07-08 DIAGNOSIS — Z191 Hormone sensitive malignancy status: Secondary | ICD-10-CM | POA: Diagnosis not present

## 2023-07-08 LAB — RAD ONC ARIA SESSION SUMMARY
Course Elapsed Days: 27
Plan Fractions Treated to Date: 18
Plan Prescribed Dose Per Fraction: 2.5 Gy
Plan Total Fractions Prescribed: 28
Plan Total Prescribed Dose: 70 Gy
Reference Point Dosage Given to Date: 45 Gy
Reference Point Session Dosage Given: 2.5 Gy
Session Number: 18

## 2023-07-09 ENCOUNTER — Ambulatory Visit
Admission: RE | Admit: 2023-07-09 | Discharge: 2023-07-09 | Disposition: A | Discharge status: Home / Self Care | Payer: Medicare Other | Source: Ambulatory Visit | Attending: Radiation Oncology | Admitting: Radiation Oncology

## 2023-07-09 ENCOUNTER — Other Ambulatory Visit: Payer: Self-pay

## 2023-07-09 DIAGNOSIS — Z191 Hormone sensitive malignancy status: Secondary | ICD-10-CM | POA: Diagnosis not present

## 2023-07-09 DIAGNOSIS — C61 Malignant neoplasm of prostate: Secondary | ICD-10-CM | POA: Diagnosis not present

## 2023-07-09 DIAGNOSIS — Z510 Encounter for antineoplastic radiation therapy: Secondary | ICD-10-CM | POA: Diagnosis not present

## 2023-07-09 LAB — RAD ONC ARIA SESSION SUMMARY
Course Elapsed Days: 28
Plan Fractions Treated to Date: 19
Plan Prescribed Dose Per Fraction: 2.5 Gy
Plan Total Fractions Prescribed: 28
Plan Total Prescribed Dose: 70 Gy
Reference Point Dosage Given to Date: 47.5 Gy
Reference Point Session Dosage Given: 2.5 Gy
Session Number: 19

## 2023-07-10 ENCOUNTER — Other Ambulatory Visit: Payer: Self-pay

## 2023-07-10 ENCOUNTER — Other Ambulatory Visit: Payer: Self-pay | Admitting: Radiation Oncology

## 2023-07-10 ENCOUNTER — Ambulatory Visit
Admission: RE | Admit: 2023-07-10 | Discharge: 2023-07-10 | Disposition: A | Discharge status: Home / Self Care | Payer: Medicare Other | Source: Ambulatory Visit | Attending: Radiation Oncology | Admitting: Radiation Oncology

## 2023-07-10 DIAGNOSIS — Z510 Encounter for antineoplastic radiation therapy: Secondary | ICD-10-CM | POA: Diagnosis not present

## 2023-07-10 DIAGNOSIS — C61 Malignant neoplasm of prostate: Secondary | ICD-10-CM | POA: Diagnosis not present

## 2023-07-10 DIAGNOSIS — Z191 Hormone sensitive malignancy status: Secondary | ICD-10-CM | POA: Diagnosis not present

## 2023-07-10 LAB — RAD ONC ARIA SESSION SUMMARY
Course Elapsed Days: 29
Plan Fractions Treated to Date: 20
Plan Prescribed Dose Per Fraction: 2.5 Gy
Plan Total Fractions Prescribed: 28
Plan Total Prescribed Dose: 70 Gy
Reference Point Dosage Given to Date: 50 Gy
Reference Point Session Dosage Given: 2.5 Gy
Session Number: 20

## 2023-07-10 MED ORDER — TAMSULOSIN HCL 0.4 MG PO CAPS: 0.4000 mg | ORAL_CAPSULE | Freq: Every day | ORAL | 5 refills | Status: DC

## 2023-07-13 ENCOUNTER — Ambulatory Visit
Admission: RE | Admit: 2023-07-13 | Discharge: 2023-07-13 | Disposition: A | Discharge status: Home / Self Care | Payer: Medicare Other | Source: Ambulatory Visit | Attending: Radiation Oncology

## 2023-07-13 ENCOUNTER — Other Ambulatory Visit: Payer: Self-pay

## 2023-07-13 DIAGNOSIS — C61 Malignant neoplasm of prostate: Secondary | ICD-10-CM | POA: Diagnosis not present

## 2023-07-13 DIAGNOSIS — Z510 Encounter for antineoplastic radiation therapy: Secondary | ICD-10-CM | POA: Diagnosis not present

## 2023-07-13 DIAGNOSIS — Z191 Hormone sensitive malignancy status: Secondary | ICD-10-CM | POA: Diagnosis not present

## 2023-07-13 LAB — RAD ONC ARIA SESSION SUMMARY
Course Elapsed Days: 32
Plan Fractions Treated to Date: 21
Plan Prescribed Dose Per Fraction: 2.5 Gy
Plan Total Fractions Prescribed: 28
Plan Total Prescribed Dose: 70 Gy
Reference Point Dosage Given to Date: 52.5 Gy
Reference Point Session Dosage Given: 2.5 Gy
Session Number: 21

## 2023-07-14 ENCOUNTER — Other Ambulatory Visit: Payer: Self-pay

## 2023-07-14 ENCOUNTER — Ambulatory Visit
Admission: RE | Admit: 2023-07-14 | Discharge: 2023-07-14 | Disposition: A | Discharge status: Home / Self Care | Payer: Medicare Other | Source: Ambulatory Visit | Attending: Radiation Oncology

## 2023-07-14 DIAGNOSIS — Z510 Encounter for antineoplastic radiation therapy: Secondary | ICD-10-CM | POA: Diagnosis not present

## 2023-07-14 DIAGNOSIS — C61 Malignant neoplasm of prostate: Secondary | ICD-10-CM | POA: Diagnosis not present

## 2023-07-14 DIAGNOSIS — Z191 Hormone sensitive malignancy status: Secondary | ICD-10-CM | POA: Diagnosis not present

## 2023-07-14 LAB — RAD ONC ARIA SESSION SUMMARY
Course Elapsed Days: 33
Plan Fractions Treated to Date: 22
Plan Prescribed Dose Per Fraction: 2.5 Gy
Plan Total Fractions Prescribed: 28
Plan Total Prescribed Dose: 70 Gy
Reference Point Dosage Given to Date: 55 Gy
Reference Point Session Dosage Given: 2.5 Gy
Session Number: 22

## 2023-07-15 ENCOUNTER — Ambulatory Visit
Admission: RE | Admit: 2023-07-15 | Discharge: 2023-07-15 | Disposition: A | Discharge status: Home / Self Care | Payer: Medicare Other | Source: Ambulatory Visit | Attending: Radiation Oncology

## 2023-07-15 ENCOUNTER — Other Ambulatory Visit: Payer: Self-pay

## 2023-07-15 DIAGNOSIS — Z191 Hormone sensitive malignancy status: Secondary | ICD-10-CM | POA: Diagnosis not present

## 2023-07-15 DIAGNOSIS — C61 Malignant neoplasm of prostate: Secondary | ICD-10-CM | POA: Diagnosis not present

## 2023-07-15 DIAGNOSIS — Z510 Encounter for antineoplastic radiation therapy: Secondary | ICD-10-CM | POA: Diagnosis not present

## 2023-07-15 LAB — RAD ONC ARIA SESSION SUMMARY
Course Elapsed Days: 34
Plan Fractions Treated to Date: 23
Plan Prescribed Dose Per Fraction: 2.5 Gy
Plan Total Fractions Prescribed: 28
Plan Total Prescribed Dose: 70 Gy
Reference Point Dosage Given to Date: 57.5 Gy
Reference Point Session Dosage Given: 2.5 Gy
Session Number: 23

## 2023-07-16 ENCOUNTER — Ambulatory Visit
Admission: RE | Admit: 2023-07-16 | Discharge: 2023-07-16 | Disposition: A | Discharge status: Home / Self Care | Payer: Medicare Other | Source: Ambulatory Visit | Attending: Radiation Oncology | Admitting: Radiation Oncology

## 2023-07-16 ENCOUNTER — Other Ambulatory Visit: Payer: Self-pay

## 2023-07-16 DIAGNOSIS — Z191 Hormone sensitive malignancy status: Secondary | ICD-10-CM | POA: Diagnosis not present

## 2023-07-16 DIAGNOSIS — C61 Malignant neoplasm of prostate: Secondary | ICD-10-CM | POA: Diagnosis not present

## 2023-07-16 DIAGNOSIS — Z510 Encounter for antineoplastic radiation therapy: Secondary | ICD-10-CM | POA: Diagnosis not present

## 2023-07-16 LAB — RAD ONC ARIA SESSION SUMMARY
Course Elapsed Days: 35
Plan Fractions Treated to Date: 24
Plan Prescribed Dose Per Fraction: 2.5 Gy
Plan Total Fractions Prescribed: 28
Plan Total Prescribed Dose: 70 Gy
Reference Point Dosage Given to Date: 60 Gy
Reference Point Session Dosage Given: 2.5 Gy
Session Number: 24

## 2023-07-17 ENCOUNTER — Ambulatory Visit
Admission: RE | Admit: 2023-07-17 | Discharge: 2023-07-17 | Disposition: A | Discharge status: Home / Self Care | Payer: Medicare Other | Source: Ambulatory Visit | Attending: Radiation Oncology

## 2023-07-17 ENCOUNTER — Ambulatory Visit
Admission: RE | Admit: 2023-07-17 | Discharge: 2023-07-17 | Disposition: A | Discharge status: Home / Self Care | Payer: Medicare Other | Source: Ambulatory Visit | Attending: Radiation Oncology | Admitting: Radiation Oncology

## 2023-07-17 ENCOUNTER — Other Ambulatory Visit: Payer: Self-pay

## 2023-07-17 DIAGNOSIS — Z191 Hormone sensitive malignancy status: Secondary | ICD-10-CM | POA: Diagnosis not present

## 2023-07-17 DIAGNOSIS — Z510 Encounter for antineoplastic radiation therapy: Secondary | ICD-10-CM | POA: Diagnosis not present

## 2023-07-17 DIAGNOSIS — C61 Malignant neoplasm of prostate: Secondary | ICD-10-CM | POA: Diagnosis not present

## 2023-07-17 LAB — RAD ONC ARIA SESSION SUMMARY
Course Elapsed Days: 36
Plan Fractions Treated to Date: 25
Plan Prescribed Dose Per Fraction: 2.5 Gy
Plan Total Fractions Prescribed: 28
Plan Total Prescribed Dose: 70 Gy
Reference Point Dosage Given to Date: 62.5 Gy
Reference Point Session Dosage Given: 2.5 Gy
Session Number: 25

## 2023-07-20 ENCOUNTER — Ambulatory Visit
Admission: RE | Admit: 2023-07-20 | Discharge: 2023-07-20 | Disposition: A | Discharge status: Home / Self Care | Payer: Medicare Other | Source: Ambulatory Visit | Attending: Radiation Oncology

## 2023-07-20 ENCOUNTER — Other Ambulatory Visit: Payer: Self-pay

## 2023-07-20 ENCOUNTER — Ambulatory Visit: Payer: Medicare Other

## 2023-07-20 DIAGNOSIS — Z191 Hormone sensitive malignancy status: Secondary | ICD-10-CM | POA: Diagnosis not present

## 2023-07-20 DIAGNOSIS — C61 Malignant neoplasm of prostate: Secondary | ICD-10-CM | POA: Diagnosis not present

## 2023-07-20 DIAGNOSIS — Z23 Encounter for immunization: Secondary | ICD-10-CM | POA: Diagnosis not present

## 2023-07-20 DIAGNOSIS — Z510 Encounter for antineoplastic radiation therapy: Secondary | ICD-10-CM | POA: Diagnosis not present

## 2023-07-20 LAB — RAD ONC ARIA SESSION SUMMARY
Course Elapsed Days: 39
Plan Fractions Treated to Date: 26
Plan Prescribed Dose Per Fraction: 2.5 Gy
Plan Total Fractions Prescribed: 28
Plan Total Prescribed Dose: 70 Gy
Reference Point Dosage Given to Date: 65 Gy
Reference Point Session Dosage Given: 2.5 Gy
Session Number: 26

## 2023-07-21 ENCOUNTER — Ambulatory Visit
Admission: RE | Admit: 2023-07-21 | Discharge: 2023-07-21 | Disposition: A | Discharge status: Home / Self Care | Payer: Medicare Other | Source: Ambulatory Visit | Attending: Radiation Oncology | Admitting: Radiation Oncology

## 2023-07-21 ENCOUNTER — Other Ambulatory Visit: Payer: Self-pay

## 2023-07-21 ENCOUNTER — Ambulatory Visit
Admission: RE | Admit: 2023-07-21 | Discharge: 2023-07-21 | Discharge status: Home / Self Care | Payer: Medicare Other | Source: Ambulatory Visit | Attending: Radiation Oncology

## 2023-07-21 ENCOUNTER — Ambulatory Visit: Payer: Medicare Other

## 2023-07-21 DIAGNOSIS — C61 Malignant neoplasm of prostate: Secondary | ICD-10-CM | POA: Diagnosis not present

## 2023-07-21 DIAGNOSIS — Z191 Hormone sensitive malignancy status: Secondary | ICD-10-CM | POA: Diagnosis not present

## 2023-07-21 DIAGNOSIS — Z510 Encounter for antineoplastic radiation therapy: Secondary | ICD-10-CM | POA: Diagnosis not present

## 2023-07-21 LAB — RAD ONC ARIA SESSION SUMMARY
Course Elapsed Days: 40
Plan Fractions Treated to Date: 27
Plan Prescribed Dose Per Fraction: 2.5 Gy
Plan Total Fractions Prescribed: 28
Plan Total Prescribed Dose: 70 Gy
Reference Point Dosage Given to Date: 67.5 Gy
Reference Point Session Dosage Given: 2.5 Gy
Session Number: 27

## 2023-07-22 ENCOUNTER — Other Ambulatory Visit: Payer: Self-pay

## 2023-07-22 ENCOUNTER — Ambulatory Visit
Admission: RE | Admit: 2023-07-22 | Discharge: 2023-07-22 | Disposition: A | Discharge status: Home / Self Care | Payer: Medicare Other | Source: Ambulatory Visit | Attending: Radiation Oncology

## 2023-07-22 DIAGNOSIS — C61 Malignant neoplasm of prostate: Secondary | ICD-10-CM | POA: Diagnosis not present

## 2023-07-22 DIAGNOSIS — Z191 Hormone sensitive malignancy status: Secondary | ICD-10-CM | POA: Diagnosis not present

## 2023-07-22 DIAGNOSIS — Z510 Encounter for antineoplastic radiation therapy: Secondary | ICD-10-CM | POA: Diagnosis not present

## 2023-07-22 LAB — RAD ONC ARIA SESSION SUMMARY
Course Elapsed Days: 41
Plan Fractions Treated to Date: 28
Plan Prescribed Dose Per Fraction: 2.5 Gy
Plan Total Fractions Prescribed: 28
Plan Total Prescribed Dose: 70 Gy
Reference Point Dosage Given to Date: 70 Gy
Reference Point Session Dosage Given: 2.5 Gy
Session Number: 28

## 2023-07-27 NOTE — Radiation Completion Notes (Signed)
Patient Name: SIMON, OHLINGER MRN: [id number] Date of Birth: 1946-02-07 Referring Physician: Crecencio Mc, M.D. Date of Service: 2023-07-27 Radiation Oncologist: Margaretmary Bayley, M.D. [facility] [place]                             RADIATION ONCOLOGY END OF TREATMENT NOTE     Diagnosis: C61 Malignant neoplasm of prostate Staging on 2022-12-22: Prostate cancer (HCC) T=cT1c, N=cN0, M=cM0 Intent: Curative     ==========DELIVERED PLANS==========  First Treatment Date: 2023-06-11 - Last Treatment Date: 2023-07-22   Plan Name: Prostate Site: Prostate Technique: IMRT Mode: Photon Dose Per Fraction: 2.5 Gy Prescribed Dose (Delivered / Prescribed): 70 Gy / 70 Gy Prescribed Fxs (Delivered / Prescribed): 28 / 28     ==========ON TREATMENT VISIT DATES========== 2023-06-11, 2023-06-26, 2023-07-02, 2023-07-10, 2023-07-17, 2023-07-21     ==========UPCOMING VISITS==========       ==========APPENDIX - ON TREATMENT VISIT NOTES==========   See weekly On Treatment Notes in Epic for details.

## 2023-07-28 NOTE — Progress Notes (Signed)
Patient was a RadOnc Consult on 02/06/23 for his stage T1c adenocarcinoma of the prostate with a Gleason's score of 3+4 and a PSA of 7.41.  Patient proceed with treatment recommendations of 5.5 weeks of daily radiation and had his final radiation treatment on 07/22/23.   Patient is scheduled for a post treatment nurse call on 08/25/23 and has his first post treatment PSA on 11/18/23 at [facility].   RN left message with patient's wife for call back to review next steps post treatment and confirm he is aware of urology follow up's.

## 2023-07-30 NOTE — Progress Notes (Signed)
RN spoke with patient and confirmed he is aware of follow up's with urology.  RN provided education on post treatment PSA monitoring.  All questions answered.  No additional needs at this time.

## 2023-08-06 ENCOUNTER — Telehealth: Payer: Self-pay | Admitting: Radiation Oncology

## 2023-08-06 ENCOUNTER — Telehealth: Payer: Self-pay

## 2023-08-06 NOTE — Telephone Encounter (Signed)
Notification received by Willie Farrell in clerical.  Patient requesting his Flomax be increased to BID PO QD.  Patient completed his radiation TX on 07/22/2023.  Spoke to nurse Vernona Rieger w/ Dr. Laverle Patter at [facility] [phone]. She has sent this request to Dr. Laverle Patter. They will call the patient to notify when completed, but I have called the patient and left a voicemail to notify him that this request is being worked on by his team at [facility]. I left my extension [phone] in case needed.    Willie Favors, LPN

## 2023-08-06 NOTE — Telephone Encounter (Signed)
12/12 patient called to requested his prescription orders for flomax be change to 2x a day, after last conversation he had with [name].  Patient is currently out and reach out to [facility] for refill, but would not refill without orders updated.  Sent secure chat to Ruel Favors, so they are aware.

## 2023-08-24 NOTE — Progress Notes (Addendum)
  Radiation Oncology         [id number]) [phone] ________________________________  Name: Willie Farrell MRN: [id number]  Date of Service: 08/24/2023  DOB: Dec 25, 1945  Post Treatment Telephone Note  Diagnosis:  C61 Malignant neoplasm of prostate (as documented in provider EOT note)  Pre Treatment IPSS Score: 14 (as documented in the provider consult note)  The patient was available for call today.   Symptoms of fatigue have improved since completing therapy.  Symptoms of bladder changes have improved since completing therapy. Current symptoms include urinary urgency, and medications for bladder symptoms include Tamsulosin .  Symptoms of bowel changes have improved since completing therapy. Current symptoms include occasional weakness of his anal sphincter and mild bowel incontinence. Medications for bowel symptoms include none.   Post Treatment IPSS Score: IPSS Questionnaire (AUA-7): Over the past month.   1)  How often have you had a sensation of not emptying your bladder completely after you finish urinating?  5 - Almost always  2)  How often have you had to urinate again less than two hours after you finished urinating? 5 - Almost always  3)  How often have you found you stopped and started again several times when you urinated?  2 - Less than half the time  4) How difficult have you found it to postpone urination?  3 - About half the time  5) How often have you had a weak urinary stream?  3 - About half the time  6) How often have you had to push or strain to begin urination?  2 - Less than half the time  7) How many times did you most typically get up to urinate from the time you went to bed until the time you got up in the morning?  5 - 5+ times  Total score:  25. Which indicates severe symptoms  0-7 mildly symptomatic   8-19 moderately symptomatic   20-35 severely symptomatic    Patient has a scheduled follow up visit with his urologist, Dr. Renda, on 11/2023 for ongoing  surveillance. He was counseled that PSA levels will be drawn in the urology office, and was reassured that additional time is expected to improve bowel and bladder symptoms. He was encouraged to call back with concerns or questions regarding radiation.   This concludes the interaction.  Rosaline Minerva, LPN

## 2023-08-25 ENCOUNTER — Ambulatory Visit
Admission: RE | Admit: 2023-08-25 | Discharge: 2023-08-25 | Disposition: A | Discharge status: Home / Self Care | Payer: Medicare Other | Source: Ambulatory Visit | Attending: Radiation Oncology | Admitting: Radiation Oncology

## 2023-08-25 DIAGNOSIS — Z510 Encounter for antineoplastic radiation therapy: Secondary | ICD-10-CM | POA: Insufficient documentation

## 2023-08-25 DIAGNOSIS — C61 Malignant neoplasm of prostate: Secondary | ICD-10-CM | POA: Insufficient documentation

## 2023-10-08 ENCOUNTER — Encounter: Payer: Self-pay | Admitting: Cardiology

## 2023-10-08 ENCOUNTER — Ambulatory Visit: Payer: Medicare Other | Attending: Cardiology | Admitting: Cardiology

## 2023-10-08 VITALS — BP 112/68 | HR 69 | Resp 16 | Ht 72.0 in | Wt 225.4 lb

## 2023-10-08 DIAGNOSIS — I452 Bifascicular block: Secondary | ICD-10-CM

## 2023-10-08 DIAGNOSIS — I25118 Atherosclerotic heart disease of native coronary artery with other forms of angina pectoris: Secondary | ICD-10-CM | POA: Diagnosis not present

## 2023-10-08 DIAGNOSIS — E7800 Pure hypercholesterolemia, unspecified: Secondary | ICD-10-CM

## 2023-10-08 NOTE — Patient Instructions (Signed)
Medication Instructions:  Your physician recommends that you continue on your current medications as directed. Please refer to the Current Medication list given to you today.  *If you need a refill on your cardiac medications before your next appointment, please call your pharmacy*  Lab Work: None ordered.  If you have labs (blood work) drawn today and your tests are completely normal, you will receive your results only by: MyChart Message (if you have MyChart) OR A paper copy in the mail If you have any lab test that is abnormal or we need to change your treatment, we will call you to review the results.  Testing/Procedures: None ordered.  Follow-Up: At [facility], you and your health needs are our priority.  As part of our continuing mission to provide you with exceptional heart care, we have created designated Provider Care Teams.  These Care Teams include your primary Cardiologist (physician) and Advanced Practice Providers (APPs -  Physician Assistants and Nurse Practitioners) who all work together to provide you with the care you need, when you need it.  Your next appointment:   1 year(s)  The format for your next appointment:   In Person  Provider:   Yates Decamp, MD  Important Information About Sugar

## 2023-10-08 NOTE — Progress Notes (Signed)
Cardiology Office Note:  .   Date:  10/08/2023  ID:  Willie Farrell, DOB [birth year], MRN [id number] PCP: Willie Brunette, MD  [facility] Providers Cardiologist:  None   History of Present Illness: .   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   With radiation therapy for prostate cancer, has continued to remain active except for bladder and bowel incontinence occasionally.  He has had a negative Lexiscan nuclear stress test on 12/03/2020.  He has not had any chest pain or dyspnea or palpitations or dizziness or syncope.   Labs   Lab Results  Component Value Date   NA 141 02/02/2023   K 4.0 02/02/2023   CO2 22 02/02/2023   GLUCOSE 97 02/02/2023   BUN 16 02/02/2023   CREATININE 1.06 02/02/2023   CALCIUM 9.4 02/02/2023   EGFR 73 02/02/2023      Latest Ref Rng & Units 02/02/2023    8:44 AM 09/22/2009   10:27 PM  BMP  Glucose 70 - 99 mg/dL 97  409   BUN 8 - 27 mg/dL 16  17   Creatinine 8.11 - 1.27 mg/dL 9.14  1.1   BUN/Creat Ratio 10 - 24 15    Sodium 134 - 144 mmol/L 141  139   Potassium 3.5 - 5.2 mmol/L 4.0  4.2   Chloride 96 - 106 mmol/L 106  109   CO2 20 - 29 mmol/L 22    Calcium 8.6 - 10.2 mg/dL 9.4        Latest Ref Rng & Units 09/22/2009   10:30 PM 09/22/2009   10:27 PM  CBC  WBC 4.0 - 10.5 K/uL 6.7    Hemoglobin 13.0 - 17.0 g/dL 78.2  95.6   Hematocrit 39.0 - 52.0 % 43.8  44.0   Platelets 150 - 400 K/uL 204     No results found for: "HGBA1C"  Lab Results  Component Value Date   TSH 2.770 02/02/2023    External Labs:  PCP Labs 11/11/2021:  Total cholesterol 129, triglycerides 138, HDL 57, LDL 44.  Non-HDL cholesterol 72.  Hb 15.2/HCT 45.7, platelets 200.  Normal indicis.  BUN 18, creatinine 0.97, EGFR 76 mL, LFTs normal.    Review of Systems  Cardiovascular:  Negative for chest pain, dyspnea on exertion and leg swelling.   Physical Exam:   VS:  BP 112/68 (BP Location: Right Arm, Patient Position: Sitting, Cuff Size: Normal)   Pulse 69   Resp 16   Ht 6' (1.829 m)   Wt 225 lb 6.4 oz (102.2 kg)   SpO2 96%   BMI 30.57 kg/m    Wt Readings from Last 3 Encounters:  10/08/23 225 lb 6.4 oz (102.2 kg)  06/08/23 218 lb (98.9 kg)  05/22/23 221 lb 12.8 oz (100.6 kg)    Physical Exam Neck:     Vascular: No carotid bruit or JVD.  Cardiovascular:     Rate and Rhythm: Normal rate and regular rhythm.     Pulses: Intact distal pulses.     Heart sounds: Normal heart sounds. No murmur heard.    No gallop.  Pulmonary:     Effort: Pulmonary effort is normal.     Breath sounds: Normal breath sounds.  Abdominal:     General: Bowel  sounds are normal.     Palpations: Abdomen is soft.  Musculoskeletal:     Right lower leg: No edema.     Left lower leg: No edema.    Studies Reviewed: .    NA  EKG:    EKG 04/07/2023: Sinus bradycardia at rate of 53 beats provide, left intrafascicular block. Incomplete right bundle branch block. No evidence of ischemia.   Medications and allergies    No Known Allergies   Current Outpatient Medications:    Alum Hydroxide-Mag Carbonate 160-105 MG CHEW, Chew by mouth as needed., Disp: , Rfl:    ascorbic acid (VITAMIN C) 500 MG tablet, Take 1 tablet by mouth daily., Disp: , Rfl:    aspirin EC 81 MG tablet, Take 81 mg by mouth at bedtime. Swallow whole., Disp: , Rfl:    cholecalciferol (VITAMIN D3) 25 MCG (1000 UNIT) tablet, Take 1,000 Units by mouth 3 (three) times a week., Disp: , Rfl:    mirabegron ER (MYRBETRIQ) 50 MG TB24 tablet, Take 50 mg by mouth in the morning and at bedtime., Disp: , Rfl:    rosuvastatin (CRESTOR) 20 MG tablet, Take 1 tablet (20 mg total) by mouth daily. (Patient taking differently: Take 20 mg by mouth at bedtime.), Disp: 90 tablet, Rfl: 3   ASSESSMENT  AND PLAN: .      ICD-10-CM   1. Coronary artery disease involving native coronary artery of native heart with other form of angina pectoris (HCC)  I25.118     2. Bifascicular block  I45.2     3. Hypercholesteremia  E78.00      1. Coronary artery disease involving native coronary artery of native heart with other form of angina pectoris (HCC) (Primary) Patient has significant elevation in coronary calcium score however fortunately remains asymptomatic and his nuclear stress test in 2022 was nonischemic.  He is presently on aspirin and also on rosuvastatin 20 mg daily, continue the same.  2. Bifascicular block He has underlying bifascicular block, he has not had any palpitations, dizziness or fatigue or decreased exercise tolerance.  Will repeat his EKG on his next office visit in a year.  3. Hypercholesteremia Lipids are well-controlled, however he has been scheduled for a complete physical next month with Dr. Merri Farrell.  Goal LDL <70.  Otherwise he remained stable from cardiac standpoint I will see him back in a year and if he remains stable on a as needed basis.  Signed,  Willie Decamp, MD, [facility] 10/08/2023, 11:56 AM [facility] [address] [place], [place] [address] Phone: [phone]. Fax:  [phone]

## 2023-10-16 DIAGNOSIS — R5383 Other fatigue: Secondary | ICD-10-CM | POA: Diagnosis not present

## 2023-10-16 DIAGNOSIS — E785 Hyperlipidemia, unspecified: Secondary | ICD-10-CM | POA: Diagnosis not present

## 2023-10-16 DIAGNOSIS — Z125 Encounter for screening for malignant neoplasm of prostate: Secondary | ICD-10-CM | POA: Diagnosis not present

## 2023-10-21 DIAGNOSIS — N401 Enlarged prostate with lower urinary tract symptoms: Secondary | ICD-10-CM | POA: Diagnosis not present

## 2023-10-21 DIAGNOSIS — Z0000 Encounter for general adult medical examination without abnormal findings: Secondary | ICD-10-CM | POA: Diagnosis not present

## 2023-10-21 DIAGNOSIS — I2510 Atherosclerotic heart disease of native coronary artery without angina pectoris: Secondary | ICD-10-CM | POA: Diagnosis not present

## 2023-10-21 DIAGNOSIS — G4726 Circadian rhythm sleep disorder, shift work type: Secondary | ICD-10-CM | POA: Diagnosis not present

## 2023-10-21 DIAGNOSIS — E785 Hyperlipidemia, unspecified: Secondary | ICD-10-CM | POA: Diagnosis not present

## 2023-10-30 DIAGNOSIS — X32XXXD Exposure to sunlight, subsequent encounter: Secondary | ICD-10-CM | POA: Diagnosis not present

## 2023-10-30 DIAGNOSIS — L820 Inflamed seborrheic keratosis: Secondary | ICD-10-CM | POA: Diagnosis not present

## 2023-10-30 DIAGNOSIS — R208 Other disturbances of skin sensation: Secondary | ICD-10-CM | POA: Diagnosis not present

## 2023-10-30 DIAGNOSIS — D225 Melanocytic nevi of trunk: Secondary | ICD-10-CM | POA: Diagnosis not present

## 2023-10-30 DIAGNOSIS — L570 Actinic keratosis: Secondary | ICD-10-CM | POA: Diagnosis not present

## 2023-10-30 DIAGNOSIS — S0501XA Injury of conjunctiva and corneal abrasion without foreign body, right eye, initial encounter: Secondary | ICD-10-CM | POA: Diagnosis not present

## 2023-11-06 DIAGNOSIS — S0501XD Injury of conjunctiva and corneal abrasion without foreign body, right eye, subsequent encounter: Secondary | ICD-10-CM | POA: Diagnosis not present

## 2023-11-25 DIAGNOSIS — R3915 Urgency of urination: Secondary | ICD-10-CM | POA: Diagnosis not present

## 2023-11-25 DIAGNOSIS — C61 Malignant neoplasm of prostate: Secondary | ICD-10-CM | POA: Diagnosis not present

## 2023-11-25 DIAGNOSIS — H35371 Puckering of macula, right eye: Secondary | ICD-10-CM | POA: Diagnosis not present

## 2023-11-25 DIAGNOSIS — N401 Enlarged prostate with lower urinary tract symptoms: Secondary | ICD-10-CM | POA: Diagnosis not present

## 2023-12-09 ENCOUNTER — Encounter: Payer: Medicare Other | Attending: Psychology | Admitting: Psychology

## 2023-12-09 DIAGNOSIS — R413 Other amnesia: Secondary | ICD-10-CM | POA: Diagnosis not present

## 2023-12-09 DIAGNOSIS — R269 Unspecified abnormalities of gait and mobility: Secondary | ICD-10-CM | POA: Diagnosis not present

## 2024-01-01 ENCOUNTER — Encounter: Attending: Psychology

## 2024-01-01 DIAGNOSIS — R413 Other amnesia: Secondary | ICD-10-CM | POA: Diagnosis not present

## 2024-01-01 NOTE — Progress Notes (Addendum)
 Behavioral Observations:  The patient appeared well-groomed and appropriately dressed. His manners were polite and appropriate to the situation. The patient's attitude towards testing was generally positive and his effort was good. The WCST was discontinued prematurely due to issues with frustration tolerance.   Neuropsychology Note  Willie Farrell completed 150 minutes of neuropsychological testing with technician, Rhett Cella, BA, under the supervision of Chapman Commodore, PsyD., Clinical Neuropsychologist. The patient did not appear overtly distressed by the testing session, per behavioral observation or via self-report to the technician. Rest breaks were offered.   Clinical Decision Making: In considering the patient's current level of functioning, level of presumed impairment, nature of symptoms, emotional and behavioral responses during clinical interview, level of literacy, and observed level of motivation/effort, a battery of tests was selected by Dr. Cheryll Corti during initial consultation on 12/09/2023. This was communicated to the technician. Communication between the neuropsychologist and technician was ongoing throughout the testing session and changes were made as deemed necessary based on patient performance on testing, technician observations and additional pertinent factors such as those listed above.  Tests Administered: Controlled Oral Word Association Test (COWAT; FAS & Animals)  Trail Making Test (TMT; Part A & B) Wechsler Adult Intelligence Scale, 4th Edition (WAIS-IV) Wechsler Memory Scale, 4th Edition (WMS-IV); Older Adult Battery  [place]  Card Sorting Test [employer])   Results:  COWAT:  FAS total= 27 Z= -.061 Animals total= 17 Z= 0.23  TMT: Trails A time= 48s Percentile Rank= 42nd Trails B time= 185s Percentile Rank= 32nd   WAIS-IV: Composite Score Summary  Scale Sum of Scaled Scores Composite Score Percentile Rank 95% Conf. Interval Qualitative  Description  Verbal Comprehension 40 VCI 118 88 112-123 High Average  Perceptual Reasoning 33 PRI 105 63 99-111 Average  Working Memory 17 WMI 92 30 86-99 Average  Processing Speed 17 PSI 92 30 84-101 Average  Full Scale 107 FSIQ 104 61 100-108 Average  General Ability 73 GAI 113 81 108-118 High Average   Verbal Comprehension Subtests Summary  Subtest Raw Score Scaled Score Percentile Rank Reference Group Scaled Score SEM  Similarities 28 13 84 11 1.12  Vocabulary 53 15 95 16 0.73  Information 17 12 75 12 0.73  The scaled scores in the Reference Group Scaled Score column are based on the performance of examinees aged 20:0-34:11 (i.e., the reference group). See Chapter 6 of the WAIS-IV Technical and Interpretive Manual for more information.  Perceptual Reasoning Subtests Summary  Subtest Raw Score Scaled Score Percentile Rank Reference Group Scaled Score SEM  Block Design 33 11 63 7 1.27  Matrix Reasoning 19 15 95 10 0.73  Visual Puzzles 7 7 16 5  0.99   Working Librarian, academic Raw Score Scaled Score Percentile Rank Reference Group Scaled Score SEM  Digit Span 21 9 37 6 0.79  Arithmetic 10 8 25 7  0.95   Processing Speed Subtests Summary  Subtest Raw Score Scaled Score Percentile Rank Reference Group Scaled Score SEM  Symbol Search 15 8 25 4  1.12  Coding 42 9 37 5 1.12    WMS-IV:   Index Score Summary  Index Sum of Scaled Scores Index Score Percentile Rank 95% Confidence Interval Qualitative Descriptor  Auditory Memory (AMI) 33 90 25 84-97 Average  Visual Memory (VMI) 17 92 30 87-97 Average  Immediate Memory (IMI) 26 91 27 85-98 Average  Delayed Memory (DMI) 24 87 19 80-96 Low Average     Primary Subtest Scaled Score Summary  Subtest Domain  Raw Score Scaled Score Percentile Rank  Logical Memory I AM 21 7 16   Logical Memory II AM 11 8 25   Verbal Paired Associates I AM 16 9 37  Verbal Paired Associates II AM 5 9 37  Visual Reproduction I VM 29 10 50   Visual Reproduction II VM 7 7 16   Symbol Span VWM 17 11 63    Auditory Memory Process Score Summary  Process Score Raw Score Scaled Score Percentile Rank Cumulative Percentage (Base Rate)  LM II Recognition 16 - - 26-50%  VPA II Recognition 28 - - 51-75%   Visual Memory Process Score Summary  Process Score Raw Score Scaled Score Percentile Rank Cumulative Percentage (Base Rate)  VR II Recognition 6 - - >75%    ABILITY-MEMORY ANALYSIS  Ability Score:  VCI: 118 Date of Testing:  WAIS-IV; WMS-IV 2024/01/01  Predicted Difference Method   Index Predicted WMS-IV Index Score Actual WMS-IV Index Score Difference Critical Value  Significant Difference Y/N Base Rate  Auditory Memory 109 90 19 9.39 Y 5-10%  Visual Memory 108 92 16 8.28 Y 10-15%  Immediate Memory 110 91 19 10.49 Y 5-10%  Delayed Memory 109 87 22 12.08 Y 5%  Statistical significance (critical value) at the .01 level.    WCST:  Administered but not completed due to difficulties with frustration tolerance   (Incomplete) test results:   Trials Administered: 127 Total Correct: 46  Total Errors: 81 %Errors: 64%  Perseverative Responses: 38 %Pers Responses: 30%  Perseverative Errors: 37 %Pers Errors: 29%  Nonperseverative Errors: 44 %Nonpers Errors: 35%  Conceptual Lvl Responses: 19 %Conceptual Lvl Responses: 15%   Categories Completed: 1 Trials to 1st: 10 Failure to Maintain Set: 0    Feedback to Patient: Montgomery Favor will return on 06/22/2024 for an interactive feedback session with Dr. Cheryll Corti at which time his test performances, clinical impressions and treatment recommendations will be reviewed in detail. The patient understands he can contact our office should he require our assistance before this time.  150 minutes spent face-to-face with patient administering standardized tests, 30 minutes spent scoring Radiographer, therapeutic). [CPT [id number], [id number]]  Full report to follow.

## 2024-02-02 ENCOUNTER — Telehealth: Payer: Self-pay | Admitting: Neurology

## 2024-02-02 NOTE — Telephone Encounter (Signed)
 Pt called stating that he has yet to see Dr. Cheryll Corti so therefore he is cx his 8 month f/u till further notice. Pt stated that his Oct appt may be moved up to Aug and he would like his appt's to coinside. Please advise.

## 2024-02-02 NOTE — Telephone Encounter (Signed)
 Let patient know to call us  back as soon as his appt with Dr Cheryll Corti gets finalized (I.e if it does get moved up to August) and then we can get him rescheduled with Dr Tresia Fruit for right after his testing and results have been given to him by Dr Cheryll Corti (usually there are two appts with him).

## 2024-02-08 ENCOUNTER — Ambulatory Visit: Payer: Medicare Other | Admitting: Neurology

## 2024-05-14 ENCOUNTER — Other Ambulatory Visit: Payer: Self-pay | Admitting: Cardiology

## 2024-06-09 ENCOUNTER — Encounter: Payer: Self-pay | Admitting: Psychology

## 2024-06-09 ENCOUNTER — Encounter: Attending: Psychology | Admitting: Psychology

## 2024-06-09 DIAGNOSIS — G3184 Mild cognitive impairment, so stated: Secondary | ICD-10-CM | POA: Diagnosis not present

## 2024-06-09 DIAGNOSIS — R269 Unspecified abnormalities of gait and mobility: Secondary | ICD-10-CM | POA: Insufficient documentation

## 2024-06-09 NOTE — Progress Notes (Signed)
 Neuropsychological Evaluation   Patient:  Willie Farrell   DOB: 1945-10-22  MR Number: [id number]  Location: [facility] [address] [place], [address] [place] [address] Dept: [phone]  Start: 8 AM End: 9 AM  Provider/Observer:     Willie Farrell JONELLE Asa PsyD  Chief Complaint:      Chief Complaint  Patient presents with   Memory Loss   Gait Problem    Reason For Service:     Willie Farrell is a 78 year old male referred for neuropsychological evaluation by his treating neurologist Willie Farrell, MDdue to reports of increasing short-term memory difficulties and gait changes. Relevant history includes prostate cancer, vitamin D deficiency, hypertension, hyperlipidemia, and anxiety. Family history is positive for dementia (father, possible Alzheimer's type pathology). Reports a long-standing history of a shuffling gait for over 40 years, which has worsened in recent years with increased balance issues, particularly when descending stairs. Denies falls or use of an assistive device. Reports increasing forgetfulness, noting memory was not as sharp even at retirement five years ago. He is able to compensate for memory lapses. Denies significant pain, fine motor control issues, or tremors.  Patient notes both episodic and semantic memory difficulties and reports cueing does tend to improve recall.  Patient describes some mild changes in visual-spatial and geographic functioning reporting that he has made wrong turns when driving but is able to self-correct.   Onset and Duration of Symptoms:  Reports a long-standing history of a shuffling gait for more than 40 years. Memory difficulties were noted around the time of retirement five years ago and have been progressively worsening.   Progression of Symptoms:  Gait changes have become more problematic and have worsened over the past few years, with  increasing balance issues. Memory difficulties are described as progressing, with more trouble with new learning.   Additional Tests and Measures from other records:   Neuroimaging Results:  An MRI from 02/11/2023 showed no acute process, with findings limited to very mild atrophy and mild vascular changes.    Laboratory Tests:  History of vitamin D deficiency. PSA levels have been slowly increasing.    Sleep:  Describes sleep as terrible. He denies snoring and has been evaluated and found not to have obstructive sleep apnea. Often reads or uses his cell phone in bed when unable to sleep. Reports being up and down frequently at night, attributed to bladder and prostate issues. Goes to bed around 10 p.m. and wakes around 7:30 a.m.   Medical History:                         Past Medical History:  Diagnosis Date   Anxiety     Benign localized prostatic hyperplasia with lower urinary tract symptoms (LUTS)     CAD (coronary artery disease)      cardiologist--- dr ladona;   Gait disorder      neruologist--- dr Farrell   History of adenomatous polyp of colon     History of basal cell carcinoma (BCC) excision      08/ 2019  nose   HOH (hard of hearing)      per pt has hearing aid but does not wear   Hypercholesteremia     Malignant neoplasm prostate [name]) 01/2021    urologist--- dr borden/  radiation onologist--- dr patrcia;  dx 06/ 2022, gleason 3+3 active surveillance ;  last  fusion bx 04/ 2024 gleason 3+4   Vitamin D deficiency     Wears glasses                                                              Patient Active Problem List    Diagnosis Date Noted   Prostate cancer (HCC) 03/19/2021   Sinus tarsi syndrome of both ankles 12/06/2020   Abnormality of gait 12/06/2020    Tests Administered: Controlled Oral Word Association Test (COWAT; FAS & Animals)  Trail Making Test (TMT; Part A & B) Wechsler Adult Intelligence Scale, 4th Edition (WAIS-IV) Wechsler Memory Scale, 4th  Edition (WMS-IV); Older Adult Battery  [place]  Card Sorting Test [facility])  Participation Level:   Active  Participation Quality:  Appropriate      Behavioral Observation:  The patient appeared well-groomed and appropriately dressed. His manners were polite and appropriate to the situation. The patient's attitude towards testing was generally positive and his effort was good. The WCST was discontinued prematurely due to issues with frustration tolerance.   Well Groomed, Alert, and Appropriate.   Test Results:   Initially, an estimation was made as the patient's premorbid intellectual and cognitive abilities.  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  The patient would have clearly been in the superior range of intellectual and cognitive functioning throughout his life relative to a normative population and likely had some degree of variability in various cognitive domains with global cognitive abilities in the superior range and excellent ability to learn and remember new information.  Secondly, an estimation was made as to the validity of the current assessment.  The patient appears to try his hardest throughout the assessment protocols although there was some increased frustration notably seen in the [place]  card sorting test which was discontinued prematurely because of limited frustration tolerance.  This is a demanding visual reasoning/problem-solving challenge that requires cognitive shifting and a combination of affective visual learning, problem-solving and shifting capacities to complete successfully.  However, this does appear to be a fair and valid assessment and I suspect the frustration derived during the [place]  card sorting test is actually a fair example of some of the cognitive  changes the patient is noted.  I suspect he would have been able to work his way through this particular cognitive challenge quite easily throughout his life.  COWAT:  FAS total= 27 Z= -.061 Animals total= 17 Z= 0.23  The patient has noted some mild word finding difficulties and retrieving specific names of places and objects but no significant change.  In order to objectively assess this cognitive domain he was administered the control oral Word Association test.  On the FAS test, which assesses lexical fluency the patient performed just below normative mean for age, education and match group.  I suspect this represents some mild change from premorbid capacity.  The patient did better on the animal naming test, which assesses semantic fluency performing at the normative mean for matched comparison group.  If anything, there are only some mild fluency changes from premorbid status.    WAIS-IV:  Composite Score Summary          Scale Sum of Scaled Scores Composite Score Percentile Rank 95% Conf. Interval Qualitative Description  Verbal Comprehension 40 VCI 118 88 112-123 High Average  Perceptual Reasoning 33 PRI 105 63 99-111 Average  Working Memory 17 WMI 92 30 86-99 Average  Processing Speed 17 PSI 92 30 84-101 Average  Full Scale 107 FSIQ 104 61 100-108 Average  General Ability 73 GAI 113 81 108-118 High Average    In order to assess a wide range of cognitive domains in a systematic highly structured and well normed battery the patient was given the Wechsler Adult Intelligence Scale-IV battery in its entirety.  As the patient is describing some changes in memory and cognition the achieved scores from this battery should not be seen as a descriptor of his lifelong cognitive and intellectual status but a reflection of his current level of functioning.  This does appear to be a fair and valid assessment.  2 Global composite scores were calculated.  The patient's full-scale IQ  score was calculated falling in the average range producing a full-scale IQ score of 104 and at the 61st percentile.  I suspect this is more than 2 standard deviations below global intellectual and cognitive levels of performance historically and would represent a finding that would suggest several cognitive domains are changing or are different from his premorbid intellectual and cognitive functioning levels.  We also calculated the patient's general abilities index score which places less emphasis on items that are prone to be more acutely affected related to attention/encoding and focus execute/information processing speed type variables.  The patient produced a general abilities index score of 113 which falls at the 81st percentile and is in the high average range.  While this score is still below premorbid estimates of intellectual and cognitive functioning it does suggest and is supported by data that the most problematic areas of cognitive functioning relate to these attentional variables including auditory encoding and information processing speed type variables.          Verbal Comprehension Subtests Summary        Subtest Raw Score Scaled Score Percentile Rank Reference Group Scaled Score SEM  Similarities 28 13 84 11 1.12  Vocabulary 53 15 95 16 0.73  Information 17 12 75 12 0.73   The patient produced a verbal comprehension index score of 118 which falls at the 88th percentile and is in the high average range relative to a normative population.  While this is roughly 1 standard deviation or more below predicted levels of premorbid functioning he did do quite well and this is his highest area of cognitive function.  These variables are often seen as hold test which are less vulnerable to acute cognitive change.  The patient performed well on measures of verbal reasoning and problem-solving performed in the superior range for vocabulary knowledge and in the high average range for his general  fund of information.  In any event, this would support predictors of his premorbid intellectual and cognitive abilities to have been in the superior range relative to a normative population.  Perceptual Reasoning Subtests Summary        Subtest Raw Score Scaled Score Percentile Rank Reference Group Scaled Score SEM  Block Design 33 11 63 7 1.27  Matrix Reasoning 19 15 95 10 0.73  Visual Puzzles 7 7 16 5  0.99   The patient produced a perceptual reasoning index score of 105 which falls at  the 63rd percentile and is in the average range relative to a normative population.  There was considerable variability noted in subtest performances.  The patient performed in the average range on measures of visual analysis and organization and making part-whole recognition types of cognitive challenges.  The patient performed exceptionally well and generally consistent with premorbid estimates on measures of broad visual intelligence and nonverbal reasoning and problem-solving capacity.  While I will address this issue more below his performance on the matrix reasoning test is an sharp contrast to the difficulties he had on the [place]  card sorting test.  These measures have similar qualities regarding visual reasoning and visual processing skills but the [place]  card sorting test requires shifting and changing of perspective throughout the measure whereas the matrix reasoning subtest has individual isolated challenges that do not require cognitive shifting within each challenge.  The patient showed significant difficulties on measures of fluid reasoning skills (visual puzzles) which is also quite consistent with what we saw on the [place]  card sorting test which also required these fluid adapting and shifting visual reasoning and problem-solving skills.  Working Comptroller Raw Score Scaled Score Percentile Rank Reference Group Scaled Score SEM  Digit Span 21 9 37 6 0.79  Arithmetic  10 8 25 7  0.95   The patient produced a working memory index score of 92 which falls at the 30th percentile and is at the lower end of the average range relative to normative population.  This is clearly well below the capacity that the patient is had to have been able to achieve and maintain throughout his adult life looking at both education and occupational history.  While these are still in the average range relative to a normative population many things that the patient successfully did through his education and occupational history would have required much higher capacity with regard to auditory encoding and his capacity to effectively process information in his auditory Register that he was able to achieve on the current assessment.  This would reflect a moderate to significant change in auditory encoding and processing auditory information actively.  This level of performance would allow for average range of storage and organization of new auditory information but would place great burden on his likely historic capacity to learn and organize new auditory information.  Processing Speed Subtests Summary        Subtest Raw Score Scaled Score Percentile Rank Reference Group Scaled Score SEM  Symbol Search 15 8 25 4  1.12  Coding 42 9 37 5 1.12   The patient produced a processing speed index score of 92 which falls in the 30th percentile and is in the average range (lower end of the average range) relative to a normative population.  This again would represent a significant change in lowered current capacity relative to lifelong capacity in this domain.  The patient's education and occupation would have required this particular skill to be quite proficient and very likely in the high average to superior range relative to a normative population and therefore there does appear to be some significant changes in his focus execute/information processing speed capacity.    WMS-IV:            Index Score  Summary        Index Sum of Scaled Scores Index Score Percentile Rank 95% Confidence Interval Qualitative Descriptor  Auditory Memory (AMI) 33 90 25 84-97 Average  Visual Memory (VMI) 17 92  30 87-97 Average  Immediate Memory (IMI) 26 91 27 85-98 Average  Delayed Memory (DMI) 24 87 19 80-96 Low Average   As the patient is primarily described the biggest change in function outside of gait changes to be memory and learning capacities the patient was administered the Wechsler Memory Scale-IV in its entirety to allow for an objective assessment of a wide range of memory learning components in a systematic and objective way that also allows for repeat testing.  The patient had some moderate difficulties with regard to primary auditory encoding and its capacity to effectively process information in his auditory Register the patient did much better with regard to visual encoding types of capacity.  The patient performed at the 63rd percentile on measures of primary visual encoding and while this is somewhat below predictions of premorbid functioning the patient certainly did not have any significant deficits with regard to visual encoding and mild weaknesses with regard to auditory encoding.  Breaking down memory functions for auditory versus visual memory learning the patient produced an auditory memory index score of 90 which falls at the 25th percentile and is in the lower end of the average range relative to a normative population.  This is likely more than 3 standard deviations below premorbid capacity in this area and would represent significant change.  The patient produced a similar type of pattern for visual memory as he produced a visual memory index score of 92 which fell at the 30th percentile and also in the lower end of the average range relative to a normative population.  Again, this is likely nearly 3 standard deviations below premorbid levels of functioning and would represent significant  change in loss and both visual and auditory memory.  Looking at immediate versus delayed memory the patient produced an immediate memory index score of 91 which falls at the 27th percentile and is in the average range.  The patient was able to generally maintain the information he initially stored and organized over a period of delay and produced a delayed memory index score of 87 falling at the low average range relative to normative population.  The patient did, however, shows significant improvements under recognition/cued recall, particularly for visual information but also showed significant improvements for auditory information with cueing and recognition.  While the patient had difficulty primarily with retrieval of information he was able to store and organize new information as demonstrated by improvements under recognition/cueing but this performance is still below what would be expected based on his premorbid intellectual and cognitive functioning.  Overall, memory functions do appear to be significantly impaired and consistent with subjective reports of changes in this domain.  While the patient shows greater difficulties with auditory encoding versus visual encoding the level of encoding and should allow for at least adequate storage and organization of information.  While the patient had some clear difficulties demonstrating successful auditory and visual encoding relative to historical capacity in the superior range he also clearly demonstrated significant improvement under recognition and cued recall.  Therefore, most of his memory difficulties appear to be more related to significant retrieval of information rather than being completely explained by an inability or difficulty/loss in his capacity to effectively store and organize new information.          Primary Subtest Scaled Score Summary       Subtest Domain Raw Score Scaled Score Percentile Rank  Logical Memory I AM 21 7 16   Logical  Memory II AM 11 8  25  Verbal Paired Associates I AM 16 9 37  Verbal Paired Associates II AM 5 9 37  Visual Reproduction I VM 29 10 50  Visual Reproduction II VM 7 7 16   Symbol Span VWM 17 11 63            Auditory Memory Process Score Summary      Process Score Raw Score Scaled Score Percentile Rank Cumulative Percentage (Base Rate)  LM II Recognition 16 - - 26-50%  VPA II Recognition 28 - - 51-75%         Visual Memory Process Score Summary      Process Score Raw Score Scaled Score Percentile Rank Cumulative Percentage (Base Rate)  VR II Recognition 6 - - >75%    TMT: Trails A time= 48s Percentile Rank= 42nd Trails B time= 185s Percentile Rank= 32nd   The patient was also administered to [employer] parts A and B.  The patient performed in the average range on the trails A subtest which primary looks at focus execute and information processing speed.  This performance is generally consistent with similar measures from the Wechsler Adult Intelligence Scale but does represent a slowed level of information processing speed relative to what would be predicted based on estimates of premorbid capacity.  The patient had a little bit more difficulty on the trails B subtest but still performed at the lower end of the average range relative to a normative population.  This measure requires more cognitive shifting and flexibility which starts to be a general theme that we see in the formal neuropsychological testing.  WCST:  Administered but not completed due to difficulties with frustration tolerance    (Incomplete) test results:    Trials Administered: 127 Total Correct: 46   Total Errors: 81 %Errors: 64%   Perseverative Responses: 38 %Pers Responses: 30%   Perseverative Errors: 37 %Pers Errors: 29%   Nonperseverative Errors: 44 %Nonpers Errors: 35%   Conceptual Lvl Responses: 19 %Conceptual Lvl Responses: 15%    Categories Completed: 1 Trials to 1st: 10 Failure to  Maintain Set: 0   The [place]  card sorting test was the one subtest that was discontinued before completion as the patient became quite frustrated with the challenge.  He was only able to complete 1 category and had difficulty handling the hard cognitive shifting and flexibility needed on this measure for successful completion.  I suspect that the patient would have manage this particular measure quite easily and the patterns we see on other measures requiring fluid visual reasoning, visual problem-solving and shifting and slower visual processing speed's explain the difficulties he had with this measure.  Summary of Results:   The patient's evaluation indicates mild to significant cognitive changes from previously higher functioning. Lexical fluency was slightly below average, while semantic fluency remained intact. Overall IQ was average (FSIQ 104), but significantly below premorbid levels, with attentional and processing speed deficits contributing to the decline. Verbal comprehension was the strongest domain (VCI 118), consistent but slightly below  superior premorbid ability. Perceptual reasoning was average, with strengths in visual intelligence but weaknesses in fluid reasoning and cognitive flexibility. Working [employer] fast streamer and processing speed were at the low end of average, reflecting declines in auditory encoding and executive function. Memory scores for both auditory and visual domains were significantly below expected levels, with retrieval difficulties being more prominent than storage issues. Recognition and cued recall showed improvement, suggesting some preservation of memory organization. Overall, the findings support notable changes in  attention, processing speed, and memory retrieval.  Impression/Diagnosis:   The results of the current neuropsychological evaluation do show consistency between the patient's subjective reports and current objective neuropsychological test data.  The primary and  most significant aspect of his memory difficulties appear to be related to difficulties with free recall and retrieval of information rather than the primary factor being an inability to store and organize new information.  While the patient is showing some moderate difficulties with auditory encoding his visual encoding appears to be quite good.  The patient's auditory and visual memory functions were significantly below premorbid estimates but when the patient was placed in cued/recognition types of challenges his performance showed significant improvement suggesting that he is actually storing and organizing more information than he is able to demonstrate in a real-world day-to-day setting.  The patient demonstrated some changes in visual-spatial and visual organizational capacity although this was much more reflexive of difficulty with visual problem-solving and fluid visual reasoning rather than inability with primary visual spatial types of capacities, which were more generally consistent with premorbid estimates.  The patient is showing some mild changes relative to premorbid estimates with regard to verbal fluency with greater difficulties relative to his on level of performance for lexical fluency versus semantic fluency.  The patient's primary cognitive weaknesses had to do with fluid/flexible visual and auditory reasoning and problem-solving capacities, difficulties with retrieval of information greater than difficulties associated with storage and organization of information and significant changes in focus execute/information processing speed which are likely having some direct impact on the primary difficulties we see with regard to fluid reasoning and problem-solving particularly for visual types of challenges.  As far as diagnostic considerations, this pattern is not 1 that you would typically see or clearly see with a Alzheimer's type pathology.  Previous MRI studies would not suggest any  significant cerebrovascular issues and no significant atrophy of the brain or significant small vessel disease/microvascular ischemic type process.  While the patient is clearly performing below premorbid/historical capacities and a number of cognitive domains at this point, it is difficult or hard to clearly assign that 2 patterns we would typically see with an Alzheimer's type pathology.  The patient is also not showing changes that would be typical of Lewy body type pathology and given the 40-year history of gait changes a clear pattern associated with Parkinson's would also be difficult to classify in that way.  At this point, the patient would meet a diagnostic criterion for mild cognitive impairment with memory loss and changes in executive functioning.  In order to be more definitive in this particular case we will clearly need to do repeat testing approximately 9 months after this particular report is being written which we will place this of around 1 year after initial formal neuropsychological test data was initially given.  Looking at particular changes over this time we will help us  be more definitive.  An Alzheimer's type pathology cannot be ruled out at this point but there are some differences between typical patterns we would expect with complications around his longstanding and very slow progressing motor changes and gait abnormalities.  No tremors are reported by the patient and the patient denies any visual hallucinations or other features that would potentially be associated with Lewy body type pathology.  As far as recommendations it may be helpful to do some further laboratory work with the patient and if he agrees.  An ATN profile and genetic testing around APOE variants may be helpful  to help delineate a more accurate or confident diagnostic consideration.  The patient is describing progressive change over the past couple of years and laboratory work, MRI etc. have not identified any acute  or metabolic issues that could explain these changes.  Diagnosis:    Mild cognitive impairment with memory loss  Abnormality of gait   _____________________ Willie Farrell Farrell, Psy.D. Clinical Neuropsychologist

## 2024-06-09 NOTE — Progress Notes (Addendum)
 Neuropsychological Consultation   Patient:   Willie Farrell   DOB:   03/23/1946  MR Number:  [id number]  Location:  [facility] PHYSICAL MEDICINE AND REHABILITATION [address] [place], [address] [place] [address] Dept: [phone]           Date of Service:   12/09/2023  Location of Service and Individuals present: Today's visit was conducted in my outpatient clinic office with the patient myself present.  Start Time:   10 AM End Time:   12 PM  1 hour and 15 minutes was spent in face-to-face clinical interview and the other 45 minutes was spent in records review, report writing and setting up testing protocols.  Patient Consent and Confidentiality: Limits of confidentiality were reviewed including the fact that the patient had been referred for neuropsychological evaluation and an explanation was made regarding what that entailed and the patient was aware that a formal report will be produced and provided to his treating neurologist as well as being made available in the patient's electronic medical records for other appropriate medical providers to have access to.  Patient consents to proceed with this evaluative process.  Consent for Evaluation and Treatment:  Signed:  Yes Explanation of Privacy Policies:  Signed:  Yes Discussion of Confidentiality Limits:  Yes  Provider/Observer:  Norleen Asa, Psy.D.       Clinical Neuropsychologist       Billing Code/Service: 96116/96121  Chief Complaint:     Chief Complaint  Patient presents with   Memory Loss   Gait Problem    Reason for Service:    Willie Farrell is a 78 year old male referred for neuropsychological evaluation by his treating neurologist True Mar, MDdue to reports of increasing short-term memory difficulties and gait changes. Relevant history includes prostate cancer, vitamin D deficiency, hypertension, hyperlipidemia, and anxiety. Family  history is positive for dementia (father, possible Alzheimer's type pathology). Reports a long-standing history of a shuffling gait for over 40 years, which has worsened in recent years with increased balance issues, particularly when descending stairs. Denies falls or use of an assistive device. Reports increasing forgetfulness, noting memory was not as sharp even at retirement five years ago. He is able to compensate for memory lapses. Denies significant pain, fine motor control issues, or tremors.  Onset and Duration of Symptoms:  Reports a long-standing history of a shuffling gait for more than 40 years. Memory difficulties were noted around the time of retirement five years ago and have been progressively worsening.  Progression of Symptoms:  Gait changes have become more problematic and have worsened over the past few years, with increasing balance issues. Memory difficulties are described as progressing, with more trouble with new learning.  Additional Tests and Measures from other records:  Neuroimaging Results:  An MRI from 02/11/2023 showed no acute process, with findings limited to very mild atrophy and mild vascular changes.   Laboratory Tests:  History of vitamin D deficiency. PSA levels have been slowly increasing.   Sleep:  Describes sleep as terrible. He denies snoring and has been evaluated and found not to have obstructive sleep apnea. Often reads or uses his cell phone in bed when unable to sleep. Reports being up and down frequently at night, attributed to bladder and prostate issues. Goes to bed around 10 p.m. and wakes around 7:30 a.m.  Medical History:   Past Medical History:  Diagnosis Date   Anxiety    Benign  localized prostatic hyperplasia with lower urinary tract symptoms (LUTS)    CAD (coronary artery disease)    cardiologist--- dr ladona;   Gait disorder    neruologist--- dr buck   History of adenomatous polyp of colon    History of basal cell carcinoma (BCC)  excision    08/ 2019  nose   HOH (hard of hearing)    per pt has hearing aid but does not wear   Hypercholesteremia    Malignant neoplasm prostate [facility]) 01/2021   urologist--- dr borden/  radiation onologist--- dr patrcia;  dx 06/ 2022, gleason 3+3 active surveillance ;  last fusion bx 04/ 2024 gleason 3+4   Vitamin D deficiency    Wears glasses          Patient Active Problem List   Diagnosis Date Noted   Prostate cancer (HCC) 03/19/2021   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 He is a retired Development worker, community. Appears well-groomed and cooperative. Attention and concentration appeared adequate. Recent memory difficulties are reported, particularly with new learning of both episodic and semantic information. Remote memory appears grossly intact. Notes mild word-finding difficulty for names of places and objects, which often come back to him later. Reports mild geographic orientation changes over the last one to two years, such as taking a wrong turn but is able to self-correct. He denies ever getting lost. Thought process was coherent and relevant. No suicidal or homicidal ideation reported. Oriented to person, place, and situation. Judgment and planning appear intact. Affect was appropriate to mood. Insight into memory changes is present. Intelligence is estimated to be in the superior range based on educational and vocational history.   Marital Status/Living:  Born and raised in [place]  with three siblings. Lives with his wife of 50 years. He has three adult sons who are successful in their respective fields.  Educational and Occupational History:     Highest Level of Education:  Graduated from medical school at the [employer].  Educational and Career Achievements: Always an Human resources officer, particularly in science  classes.  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  Hobbies and Interests: Has recently started going to the gym more frequently.  Psychiatric History:  History of some anxiety.  History of Substance Use or Abuse:  No concerns of substance abuse are reported.    Family Med/Psych History:  Family History  Problem Relation Age of Onset   Pneumonia Mother    Diabetes Father    Heart attack Father 30   Cancer Paternal Aunt        gynecological cancer   Pancreatic cancer Maternal Grandmother    Bladder Cancer Maternal Grandfather     Impression/DX:   Willie Farrell is a 78 year old male referred by his neurologist for neuropsychological evaluation for concerns of short-term memory decline and gait changes. He reports increasing forgetfulness and problems with new learning. He reports a shuffling gait for over 40 years that has worsened recently, with associated balance problems but no falls. He is able to compensate for memory lapses but acknowledges he is not as sharp as he used to be. Reports mild word-finding and geographic orientation difficulties. He has a family history of dementia in his father. MRI showed only mild age-related  changes.  Disposition/Plan:  The patient has been set up for formal neuropsychological evaluation and will complete a foundational battery of the Wechsler Adult Intelligence Scale's in the Wechsler Memory Scale's as well as the administered the control oral Word Association test and the Trail Making Test part A&B and [place]  card sorting test.  Once this foundational battery is completed a determination will be made as to any particular need for further assessment to help delineate any questions that may arise after initial analysis of the foundational battery.  A formal report will be completed and a review of the results with diagnostic considerations etc. will  be made with the patient in person and formal report to be made available to the patient's treating neurologist.  Diagnosis:    Memory loss  Abnormality of gait        Note: This document was prepared using Dragon voice recognition software and may include unintentional dictation errors.   Electronically Signed   _______________________ Norleen Asa, Psy.D. Clinical Neuropsychologist

## 2024-06-22 ENCOUNTER — Encounter (HOSPITAL_BASED_OUTPATIENT_CLINIC_OR_DEPARTMENT_OTHER): Admitting: Psychology

## 2024-06-22 DIAGNOSIS — R413 Other amnesia: Secondary | ICD-10-CM

## 2024-06-22 DIAGNOSIS — G3184 Mild cognitive impairment, so stated: Secondary | ICD-10-CM

## 2024-06-22 DIAGNOSIS — R269 Unspecified abnormalities of gait and mobility: Secondary | ICD-10-CM

## 2024-06-29 DIAGNOSIS — C61 Malignant neoplasm of prostate: Secondary | ICD-10-CM | POA: Diagnosis not present

## 2024-07-05 ENCOUNTER — Encounter: Payer: Self-pay | Admitting: Psychology

## 2024-07-05 NOTE — Progress Notes (Signed)
 Neuropsychological Evaluation   Patient:  Willie Farrell   DOB: 21-Jul-1946  MR Number: [id number]  Location: [facility] PHYSICAL MEDICINE AND REHABILITATION [address] [place], [address] [place] [address] Dept: [phone]  Start: 10 AM End: 9 AM   Provider/Observer:     Norleen JONELLE Asa PsyD  Chief Complaint:      Chief Complaint  Patient presents with   Memory Loss   Gait Problem    06/22/2024 10 AM-11 AM: Today I provided feedback regarding the results of the recent neuropsychological evaluation.  Below you will find a summation of the details from the visit today where the back of the evaluation with diagnostic considerations were reviewed as well as specific treatment plans for the patient themselves outside of those previously mentioned in the formal neuropsychological evaluative report.  I have included the reason for service and the summary of the formal neuropsychological evaluation below for convenience and the complete neuropsychological evaluation can be found in the patient's EMR dated 06/09/2024.  During today's feedback session the patient reports subjective cognitive decline, specifically worsening difficulty with word-finding (names) and reduced retention of auditory information, requiring repetition. Describes being more scatterbrained and forgetting intended tasks or requests from his wife if not performed immediately. Reports a long-standing tendency, which may be worsening, to switch between tasks without completing them, which he self-describes as potentially related to OCD. Denies getting lost.  Neuropsychological Evaluation Findings discussed today: The neuropsychological evaluation integrated subjective reports, available medical information, and objective cognitive testing. Testing results were consistent with subjective reports of cognitive changes. Performance was below predicted baseline  in several areas, but there was no evidence of a precipitous decline. The overall pattern of cognitive strengths and weaknesses is not consistent with a typical Alzheimer's disease or Lewy Body dementia presentation. Memory storage appears intact, with deficits noted primarily in retrieval rather than encoding. The presentation meets the criteria for Mild Cognitive Impairment. Gait changes are long-standing and not progressive in a manner suggestive of a Parkinsonian condition.  Medical Information: MRI did not show evidence of significant small vessel disease or other vascular causes for the cognitive changes. Overall medical health is good.  Impression & Recommendations: The current clinical picture is consistent with Mild Cognitive Impairment. The pattern is not typical for a progressive neurodegenerative process like Alzheimer's disease. The cognitive changes are most likely age-related.  Plan: 1. Repeat neuropsychological testing in approximately 9 months (around one year from the initial evaluation) to establish a personal baseline and monitor for any progression. 2. Consider blood work to further investigate, including an ATN profile (P-tau 181 and A? 42/40 ratio) and APOE genotyping (APOE4). It was explained these tests are not diagnostic but can provide additional information. To be discussed with primary care provider. 3. Provided psychoeducation on cognitive health strategies: - Maintain a nutritious diet with antioxidants. - Engage in sustained daily physical activity. - Prioritize good sleep hygiene, including obtaining 15 minutes of sun exposure at sunrise and sunset to regulate sleep-wake cycles. - If concerns for obstructive sleep apnea arise (e.g., snoring), seek testing and treatment. 4. Discussed the role of intrusive thoughts (related to his self-report of OCD-like thinking) in functionally reducing attention and memory encoding. Advised on the 22-second rule to consciously  hold a new piece of information (e.g., a request) in active attention for at least 22 seconds to improve the likelihood of it being stored in memory. 5. Advised against unproven supplements, noting  a lack of peer-reviewed evidence for their efficacy in improving memory or treating cognitive conditions. Discussed the marketing of such products and the importance of ensuring any supplement does not pose a health risk. 6. Follow-up appointment scheduled in approximately 9 months to review progress and determine if repeat testing is warranted.  Reason For Service:     Willie Farrell is a 78 year old male referred for neuropsychological evaluation by his treating neurologist True Mar, MDdue to reports of increasing short-term memory difficulties and gait changes. Relevant history includes prostate cancer, vitamin D deficiency, hypertension, hyperlipidemia, and anxiety. Family history is positive for dementia (father, possible Alzheimer's type pathology). Reports a long-standing history of a shuffling gait for over 40 years, which has worsened in recent years with increased balance issues, particularly when descending stairs. Denies falls or use of an assistive device. Reports increasing forgetfulness, noting memory was not as sharp even at retirement five years ago. He is able to compensate for memory lapses. Denies significant pain, fine motor control issues, or tremors.  Patient notes both episodic and semantic memory difficulties and reports cueing does tend to improve recall.  Patient describes some mild changes in visual-spatial and geographic functioning reporting that he has made wrong turns when driving but is able to self-correct.   Onset and Duration of Symptoms:  Reports a long-standing history of a shuffling gait for more than 40 years. Memory difficulties were noted around the time of retirement five years ago and have been progressively worsening.   Progression of Symptoms:  Gait changes  have become more problematic and have worsened over the past few years, with increasing balance issues. Memory difficulties are described as progressing, with more trouble with new learning.   Additional Tests and Measures from other records:   Neuroimaging Results:  An MRI from 02/11/2023 showed no acute process, with findings limited to very mild atrophy and mild vascular changes.    Laboratory Tests:  History of vitamin D deficiency. PSA levels have been slowly increasing.    Sleep:  Describes sleep as terrible. He denies snoring and has been evaluated and found not to have obstructive sleep apnea. Often reads or uses his cell phone in bed when unable to sleep. Reports being up and down frequently at night, attributed to bladder and prostate issues. Goes to bed around 10 p.m. and wakes around 7:30 a.m.    Impression/Diagnosis:   The results of the current neuropsychological evaluation do show consistency between the patient's subjective reports and current objective neuropsychological test data.  The primary and most significant aspect of his memory difficulties appear to be related to difficulties with free recall and retrieval of information rather than the primary factor being an inability to store and organize new information.  While the patient is showing some moderate difficulties with auditory encoding his visual encoding appears to be quite good.  The patient's auditory and visual memory functions were significantly below premorbid estimates but when the patient was placed in cued/recognition types of challenges his performance showed significant improvement suggesting that he is actually storing and organizing more information than he is able to demonstrate in a real-world day-to-day setting.  The patient demonstrated some changes in visual-spatial and visual organizational capacity although this was much more reflexive of difficulty with visual problem-solving and fluid visual reasoning  rather than inability with primary visual spatial types of capacities, which were more generally consistent with premorbid estimates.  The patient is showing some mild changes relative to premorbid estimates with  regard to verbal fluency with greater difficulties relative to his on level of performance for lexical fluency versus semantic fluency.  The patient's primary cognitive weaknesses had to do with fluid/flexible visual and auditory reasoning and problem-solving capacities, difficulties with retrieval of information greater than difficulties associated with storage and organization of information and significant changes in focus execute/information processing speed which are likely having some direct impact on the primary difficulties we see with regard to fluid reasoning and problem-solving particularly for visual types of challenges.  As far as diagnostic considerations, this pattern is not 1 that you would typically see or clearly see with a Alzheimer's type pathology.  Previous MRI studies would not suggest any significant cerebrovascular issues and no significant atrophy of the brain or significant small vessel disease/microvascular ischemic type process.  While the patient is clearly performing below premorbid/historical capacities and a number of cognitive domains at this point, it is difficult or hard to clearly assign that 2 patterns we would typically see with an Alzheimer's type pathology.  The patient is also not showing changes that would be typical of Lewy body type pathology and given the 40-year history of gait changes a clear pattern associated with Parkinson's would also be difficult to classify in that way.  At this point, the patient would meet a diagnostic criterion for mild cognitive impairment with memory loss and changes in executive functioning.  In order to be more definitive in this particular case we will clearly need to do repeat testing approximately 9 months after this  particular report is being written which we will place this of around 1 year after initial formal neuropsychological test data was initially given.  Looking at particular changes over this time we will help us  be more definitive.  An Alzheimer's type pathology cannot be ruled out at this point but there are some differences between typical patterns we would expect with complications around his longstanding and very slow progressing motor changes and gait abnormalities.  No tremors are reported by the patient and the patient denies any visual hallucinations or other features that would potentially be associated with Lewy body type pathology.  As far as recommendations it may be helpful to do some further laboratory work with the patient and if he agrees.  An ATN profile and genetic testing around APOE variants may be helpful to help delineate a more accurate or confident diagnostic consideration.  The patient is describing progressive change over the past couple of years and laboratory work, MRI etc. have not identified any acute or metabolic issues that could explain these changes.  Diagnosis:    Mild cognitive impairment with memory loss  Memory loss  Abnormality of gait   _____________________ Norleen Asa, Psy.D. Clinical Neuropsychologist

## 2024-07-06 DIAGNOSIS — N401 Enlarged prostate with lower urinary tract symptoms: Secondary | ICD-10-CM | POA: Diagnosis not present

## 2024-07-06 DIAGNOSIS — N400 Enlarged prostate without lower urinary tract symptoms: Secondary | ICD-10-CM | POA: Diagnosis not present

## 2024-07-06 DIAGNOSIS — R3912 Poor urinary stream: Secondary | ICD-10-CM | POA: Diagnosis not present

## 2024-07-06 DIAGNOSIS — C61 Malignant neoplasm of prostate: Secondary | ICD-10-CM | POA: Diagnosis not present

## 2025-03-22 ENCOUNTER — Encounter: Admitting: Psychology
